# Patient Record
Sex: Male | Born: 1955
Health system: Southern US, Community
[De-identification: ages and names within clinical notes are randomized; demographics above are authoritative.]

## PROBLEM LIST (undated history)

## (undated) DIAGNOSIS — F419 Anxiety disorder, unspecified: Secondary | ICD-10-CM

## (undated) DIAGNOSIS — H548 Legal blindness, as defined in USA: Secondary | ICD-10-CM

## (undated) DIAGNOSIS — E785 Hyperlipidemia, unspecified: Secondary | ICD-10-CM

## (undated) DIAGNOSIS — K219 Gastro-esophageal reflux disease without esophagitis: Secondary | ICD-10-CM

## (undated) DIAGNOSIS — N400 Enlarged prostate without lower urinary tract symptoms: Secondary | ICD-10-CM

## (undated) DIAGNOSIS — K222 Esophageal obstruction: Secondary | ICD-10-CM

## (undated) DIAGNOSIS — K635 Polyp of colon: Secondary | ICD-10-CM

## (undated) DIAGNOSIS — J189 Pneumonia, unspecified organism: Secondary | ICD-10-CM

## (undated) DIAGNOSIS — T7840XA Allergy, unspecified, initial encounter: Secondary | ICD-10-CM

## (undated) DIAGNOSIS — M069 Rheumatoid arthritis, unspecified: Secondary | ICD-10-CM

## (undated) DIAGNOSIS — M1990 Unspecified osteoarthritis, unspecified site: Secondary | ICD-10-CM

## (undated) HISTORY — PX: OTHER SURGICAL HISTORY: SHX169

## (undated) HISTORY — PX: FRACTURE SURGERY: SHX138

## (undated) HISTORY — DX: Hyperlipidemia, unspecified: E78.5

## (undated) HISTORY — PX: ESOPHAGOGASTRODUODENOSCOPY: SHX1529

## (undated) HISTORY — PX: POLYPECTOMY: SHX149

## (undated) HISTORY — DX: Rheumatoid arthritis, unspecified: M06.9

## (undated) HISTORY — PX: TONSILLECTOMY: SUR1361

## (undated) HISTORY — DX: Unspecified osteoarthritis, unspecified site: M19.90

## (undated) HISTORY — DX: Anxiety disorder, unspecified: F41.9

## (undated) HISTORY — DX: Esophageal obstruction: K22.2

## (undated) HISTORY — DX: Allergy, unspecified, initial encounter: T78.40XA

## (undated) HISTORY — PX: UPPER GASTROINTESTINAL ENDOSCOPY: SHX188

## (undated) HISTORY — PX: EYE SURGERY: SHX253

---

## 2012-01-23 HISTORY — PX: COLONOSCOPY: SHX174

## 2012-10-17 DIAGNOSIS — Z86010 Personal history of colonic polyps: Secondary | ICD-10-CM | POA: Insufficient documentation

## 2013-08-19 ENCOUNTER — Encounter (HOSPITAL_COMMUNITY): Payer: Self-pay | Admitting: Emergency Medicine

## 2013-08-19 ENCOUNTER — Emergency Department (HOSPITAL_COMMUNITY)
Admission: EM | Admit: 2013-08-19 | Discharge: 2013-08-19 | Disposition: A | Discharge status: Home / Self Care | Payer: 59 | Source: Home / Self Care | Attending: Family Medicine | Admitting: Family Medicine

## 2013-08-19 DIAGNOSIS — S61209A Unspecified open wound of unspecified finger without damage to nail, initial encounter: Secondary | ICD-10-CM

## 2013-08-19 DIAGNOSIS — S61011A Laceration without foreign body of right thumb without damage to nail, initial encounter: Secondary | ICD-10-CM

## 2013-08-19 DIAGNOSIS — W010XXA Fall on same level from slipping, tripping and stumbling without subsequent striking against object, initial encounter: Secondary | ICD-10-CM

## 2013-08-19 MED ORDER — CEPHALEXIN 500 MG PO CAPS: 500.0000 mg | ORAL_CAPSULE | Freq: Three times a day (TID) | ORAL | Status: DC

## 2013-08-19 NOTE — Discharge Instructions (Signed)

## 2013-08-19 NOTE — ED Notes (Signed)
Reports laceration to right thumb.  Pt states that he tripped and fell on a saw.   Bleeding is controlled.  Wound cleaned and covered.

## 2013-08-19 NOTE — ED Provider Notes (Signed)
CSN: [id number]     Arrival date & time 08/19/13  1018 History   First MD Initiated Contact with Patient 08/19/13 1034     Chief Complaint  Patient presents with  . Laceration   (Consider location/radiation/quality/duration/timing/severity/associated sxs/prior Treatment) HPI Comments: 58 year old male presents complaining of a laceration to his right thumb. He tripped and fell on a saw. The bleeding has been controlled with direct pressure but he thinks he may need stitches. He denies any numbness in the thumb or any other injury. He also complains of a shoulder injury sustained while being arrested yesterday, but he does not want to be seen for that. His history includes rheumatoid arthritis, he is on xeljanz for that.   History reviewed. No pertinent past medical history. History reviewed. No pertinent past surgical history. History reviewed. No pertinent family history. History  Substance Use Topics  . Smoking status: Never Smoker   . Smokeless tobacco: Not on file  . Alcohol Use: No    Review of Systems  Skin: Positive for wound.  All other systems reviewed and are negative.   Allergies  Review of patient's allergies indicates no known allergies.  Home Medications   Prior to Admission medications   Medication Sig Start Date End Date Taking? Authorizing Provider  cephALEXin (KEFLEX) 500 MG capsule Take 1 capsule (500 mg total) by mouth 3 (three) times daily. 08/19/13   Freeman Caldron Yuvia Plant, PA-C   BP 146/90  Pulse 108  Temp(Src) 98.2 F (36.8 C) (Oral)  Resp 18  SpO2 98% Physical Exam  Nursing note and vitals reviewed. Constitutional: He is oriented to person, place, and time. He appears well-developed and well-nourished. No distress.  HENT:  Head: Normocephalic.  Pulmonary/Chest: Effort normal. No respiratory distress.  Musculoskeletal:       Right hand: He exhibits laceration.       Hands: Neurological: He is alert and oriented to person, place, and time. Coordination  normal.  Skin: Skin is warm and dry. No rash noted. He is not diaphoretic.  Psychiatric: He has a normal mood and affect. Judgment normal.    ED Course  LACERATION REPAIR Date/Time: 08/19/2013 12:37 PM Performed by: Allena Katz, H Authorized by: Allena Katz, H Consent: Verbal consent obtained. Risks and benefits: risks, benefits and alternatives were discussed Consent given by: patient Patient identity confirmed: verbally with patient Time out: Immediately prior to procedure a "time out" was called to verify the correct patient, procedure, equipment, support staff and site/side marked as required. Body area: upper extremity Location details: right hand Laceration length: 6 cm Foreign bodies: no foreign bodies Tendon involvement: none Nerve involvement: none Vascular damage: no Anesthesia: local infiltration Local anesthetic: lidocaine 2% with epinephrine Anesthetic total: 10 ml Preparation: Patient was prepped and draped in the usual sterile fashion. Irrigation solution: saline and tap water Irrigation method: tap and syringe Amount of cleaning: extensive Debridement: none Degree of undermining: none Skin closure: 5-0 Prolene Number of sutures: 14 Technique: simple Approximation: close Approximation difficulty: complex Dressing: antibiotic ointment and 4x4 sterile gauze Patient tolerance: Patient tolerated the procedure well with no immediate complications.   (including critical care time) Labs Review Labs Reviewed - No data to display  Imaging Review No results found.   MDM   1. Laceration of thumb, right, initial encounter    The wound was anesthetized with 2% lidocaine with epinephrine and copiously irrigated with tap water and then 1 L of sterile water. The wound was explored and no foreign bodies identified,  no nerves or tendon involvement. Repaired with 14 sutures 5-0 Prolene, will use Keflex for infection prophylaxis, especially considering that he is on  a DMARD for rheumatoid arthritis. Followup in 10 days for suture removal. He already has pain medicine at home  Meds ordered this encounter  Medications  . cephALEXin (KEFLEX) 500 MG capsule    Sig: Take 1 capsule (500 mg total) by mouth 3 (three) times daily.    Dispense:  15 capsule    Refill:  0    Order Specific Question:  Supervising Provider    Answer:  MERRELL, DAVID J [[id number]]     14 sutures 5-0 prolene   Liam Graham, PA-C 08/19/13 1243

## 2013-08-19 NOTE — ED Provider Notes (Signed)
Medical screening examination/treatment/procedure(s) were performed by a resident physician or non-physician practitioner and as the supervising physician I was immediately available for consultation/collaboration.  Linna Darner, MD Family Medicine   Waldemar Dickens, MD 08/19/13 1346

## 2013-09-17 ENCOUNTER — Other Ambulatory Visit: Payer: Self-pay | Admitting: Orthopedic Surgery

## 2013-09-17 DIAGNOSIS — S43422A Sprain of left rotator cuff capsule, initial encounter: Secondary | ICD-10-CM

## 2013-09-24 ENCOUNTER — Ambulatory Visit
Admission: RE | Admit: 2013-09-24 | Discharge: 2013-09-24 | Disposition: A | Discharge status: Home / Self Care | Payer: 59 | Source: Ambulatory Visit | Attending: Orthopedic Surgery | Admitting: Orthopedic Surgery

## 2013-09-24 DIAGNOSIS — S43422A Sprain of left rotator cuff capsule, initial encounter: Secondary | ICD-10-CM

## 2013-09-24 MED ORDER — IOHEXOL 180 MG/ML  SOLN
15.0000 mL | Freq: Once | INTRAMUSCULAR | Status: AC | PRN
  Administered 2013-09-24: 15 mL via INTRA_ARTICULAR

## 2013-10-08 DIAGNOSIS — H02831 Dermatochalasis of right upper eyelid: Secondary | ICD-10-CM | POA: Insufficient documentation

## 2013-10-08 DIAGNOSIS — H02834 Dermatochalasis of left upper eyelid: Secondary | ICD-10-CM

## 2013-11-04 ENCOUNTER — Encounter (HOSPITAL_COMMUNITY): Payer: Self-pay

## 2013-11-04 NOTE — H&P (Signed)
  Grant Hunter is an 58 y.o. male.    Chief Complaint: left shoulder pain  HPI: Pt is a 58 y.o. male complaining of left shoulder pain for multiple months. Pain had continually increased since the beginning. X-rays in the clinic show [facility] arthrosis and slap tear. Pt has tried various conservative treatments which have failed to alleviate their symptoms, including therapy and injections. Various options are discussed with the patient. Risks, benefits and expectations were discussed with the patient. Patient understand the risks, benefits and expectations and wishes to proceed with surgery.   PCP:  Sharmon Leyden, MD  D/C Plans:  Home   PMH: No past medical history on file.  PSH: No past surgical history on file.  Social History:  reports that he has never smoked. He does not have any smokeless tobacco history on file. He reports that he does not drink alcohol or use illicit drugs.  Allergies:  No Known Allergies  Medications: No current facility-administered medications for this encounter.   Current Outpatient Prescriptions  Medication Sig Dispense Refill  . cephALEXin (KEFLEX) 500 MG capsule Take 1 capsule (500 mg total) by mouth 3 (three) times daily.  15 capsule  0    No results found for this or any previous visit (from the past 48 hour(s)). No results found.  ROS: Pain with rom of the left upper extremity  Physical Exam:  Alert and oriented 58 y.o. male in no acute distress Cranial nerves 2-12 intact Cervical spine: full rom with no tenderness, nv intact distally Chest: active breath sounds bilaterally, no wheeze rhonchi or rales Heart: regular rate and rhythm, no murmur Abd: non tender non distended with active bowel sounds Hip is stable with rom  Left shoulder with positive obrien's and cross arm tests nv intact distally rom is minimally decreased No rashes or edema Strength of ER and IR 4.5/5   Assessment/Plan Assessment: left shoulder pain secondary to [facility]  arthrosis and slap tear  Plan: Patient will undergo a left shoulder scope with biceps tenodesis by Dr. Veverly Fells at [facility]. Risks benefits and expectations were discussed with the patient. Patient understand risks, benefits and expectations and wishes to proceed.

## 2013-11-05 ENCOUNTER — Encounter (HOSPITAL_COMMUNITY): Payer: Self-pay

## 2013-11-05 ENCOUNTER — Encounter (HOSPITAL_COMMUNITY)
Admission: RE | Admit: 2013-11-05 | Discharge: 2013-11-05 | Disposition: A | Discharge status: Home / Self Care | Payer: 59 | Source: Ambulatory Visit | Attending: Orthopedic Surgery | Admitting: Orthopedic Surgery

## 2013-11-05 ENCOUNTER — Other Ambulatory Visit (HOSPITAL_COMMUNITY): Payer: 59

## 2013-11-05 ENCOUNTER — Other Ambulatory Visit (HOSPITAL_COMMUNITY): Payer: Self-pay | Admitting: *Deleted

## 2013-11-05 DIAGNOSIS — M25512 Pain in left shoulder: Secondary | ICD-10-CM | POA: Diagnosis not present

## 2013-11-05 DIAGNOSIS — Z01818 Encounter for other preprocedural examination: Secondary | ICD-10-CM | POA: Diagnosis not present

## 2013-11-05 HISTORY — DX: Pneumonia, unspecified organism: J18.9

## 2013-11-05 HISTORY — DX: Polyp of colon: K63.5

## 2013-11-05 HISTORY — DX: Legal blindness, as defined in USA: H54.8

## 2013-11-05 HISTORY — DX: Gastro-esophageal reflux disease without esophagitis: K21.9

## 2013-11-05 HISTORY — DX: Benign prostatic hyperplasia without lower urinary tract symptoms: N40.0

## 2013-11-05 LAB — BASIC METABOLIC PANEL
Anion gap: 13 (ref 5–15)
BUN: 15 mg/dL (ref 6–23)
CALCIUM: 9.1 mg/dL (ref 8.4–10.5)
CO2: 26 mEq/L (ref 19–32)
Chloride: 99 mEq/L (ref 96–112)
Creatinine, Ser: 1.14 mg/dL (ref 0.50–1.35)
GFR calc Af Amer: 79 mL/min — ABNORMAL LOW (ref >90)
GFR, EST NON AFRICAN AMERICAN: 68 mL/min — AB (ref >90)
GLUCOSE: 98 mg/dL (ref 70–99)
Potassium: 4.5 mEq/L (ref 3.7–5.3)
Sodium: 138 mEq/L (ref 137–147)

## 2013-11-05 LAB — CBC
HCT: 41.2 % (ref 39.0–52.0)
Hemoglobin: 13.5 g/dL (ref 13.0–17.0)
MCH: 29.2 pg (ref 26.0–34.0)
MCHC: 32.8 g/dL (ref 30.0–36.0)
MCV: 89.2 fL (ref 78.0–100.0)
Platelets: 244 10*3/uL (ref 150–400)
RBC: 4.62 MIL/uL (ref 4.22–5.81)
RDW: 13.9 % (ref 11.5–15.5)
WBC: 7.7 10*3/uL (ref 4.0–10.5)

## 2013-11-05 NOTE — Pre-Procedure Instructions (Signed)
Grant Hunter  11/05/2013   Your procedure is scheduled on:  Monday, November 09, 2013 at 1:55 PM.   Report to [facility] Entrance "A" Admitting Office at 12:00 Noon.   Call this number if you have problems the morning of surgery: [phone]   Remember:   Do not eat food or drink liquids after midnight Sunday, 11/08/13.   Take these medicines the morning of surgery with A SIP OF WATER:  omeprazole (PRILOSEC), traMADol (ULTRAM), ALPRAZolam [name]) - if needed.  Stop Krill Oil and Vitamin E as of today.   Do not wear jewelry.  Do not wear lotions, powders, or cologne. You may NOT wear deodorant.  Men may shave face and neck.  Do not bring valuables to the hospital.  [facility] is not responsible                  for any belongings or valuables.               Contacts, dentures or bridgework may not be worn into surgery.  Leave suitcase in the car. After surgery it may be brought to your room.  For patients admitted to the hospital, discharge time is determined by your                treatment team.               Patients discharged the day of surgery will not be allowed to drive home.   Special Instructions: [place] - Preparing for Surgery  Before surgery, you can play an important role.  Because skin is not sterile, your skin needs to be as free of germs as possible.  You can reduce the number of germs on you skin by washing with CHG (chlorahexidine gluconate) soap before surgery.  CHG is an antiseptic cleaner which kills germs and bonds with the skin to continue killing germs even after washing.  Please DO NOT use if you have an allergy to CHG or antibacterial soaps.  If your skin becomes reddened/irritated stop using the CHG and inform your nurse when you arrive at Short Stay.  Do not shave (including legs and underarms) for at least 48 hours prior to the first CHG shower.  You may shave your face.  Please follow these instructions carefully:   1.  Shower with CHG  Soap the night before surgery and the                                morning of Surgery.  2.  If you choose to wash your hair, wash your hair first as usual with your       normal shampoo.  3.  After you shampoo, rinse your hair and body thoroughly to remove the                      Shampoo.  4.  Use CHG as you would any other liquid soap.  You can apply chg directly       to the skin and wash gently with scrungie or a clean washcloth.  5.  Apply the CHG Soap to your body ONLY FROM THE NECK DOWN.        Do not use on open wounds or open sores.  Avoid contact with your eyes, ears, mouth and genitals (private parts).  Wash genitals (private parts) with your normal soap.  6.  Wash thoroughly, paying special attention to the area where your surgery        will be performed.  7.  Thoroughly rinse your body with warm water from the neck down.  8.  DO NOT shower/wash with your normal soap after using and rinsing off       the CHG Soap.  9.  Pat yourself dry with a clean towel.            10.  Wear clean pajamas.            11.  Place clean sheets on your bed the night of your first shower and do not        sleep with pets.  Day of Surgery  Do not apply any lotions/deodorants the morning of surgery.  Please wear clean clothes to the hospital.     Please read over the following fact sheets that you were given: Pain Booklet, Coughing and Deep Breathing and Surgical Site Infection Prevention

## 2013-11-05 NOTE — Progress Notes (Addendum)
Pt's Rheumatologist is Dr. Ouida Sills. PCP is Dr. Frederic Jericho at [facility].  Stop Bang assessment sent to Dr. Frederic Jericho  Pt stopped Morrie Sheldon 10/29/13 per order of his rheumatologist.

## 2013-11-05 NOTE — Progress Notes (Signed)
11/05/13 [place]  Have you ever been diagnosed with sleep apnea through a sleep study? No  Do you snore loudly (loud enough to be heard through closed doors)?  0  Do you often feel tired, fatigued, or sleepy during the daytime? 1  Has anyone observed you stop breathing during your sleep? 0  Do you have, or are you being treated for high blood pressure? 0  BMI more than 35 kg/m2? 0  Age over 60 years old? 1  Neck circumference greater than 40 cm/16 inches? 1 (17.5)  Gender: 1  Obstructive Sleep Apnea Score 4  Score 4 or greater  Results sent to PCP

## 2013-11-06 NOTE — Progress Notes (Signed)
Spoke with pt.'s wife, informed them that surgery time has changed that has  him to presenting  to hosp. At 10:00 on 11/09/2013

## 2013-11-08 MED ORDER — CEFAZOLIN SODIUM-DEXTROSE 2-3 GM-% IV SOLR: 2.0000 g | INTRAVENOUS | Status: AC

## 2013-11-09 ENCOUNTER — Ambulatory Visit (HOSPITAL_COMMUNITY): Payer: 59 | Admitting: Certified Registered"

## 2013-11-09 ENCOUNTER — Encounter (HOSPITAL_COMMUNITY)
Admission: RE | Disposition: A | Discharge status: Home / Self Care | Payer: Self-pay | Source: Ambulatory Visit | Attending: Orthopedic Surgery

## 2013-11-09 ENCOUNTER — Ambulatory Visit (HOSPITAL_COMMUNITY)
Admission: RE | Admit: 2013-11-09 | Discharge: 2013-11-09 | Disposition: A | Discharge status: Home / Self Care | Payer: 59 | Source: Ambulatory Visit | Attending: Orthopedic Surgery | Admitting: Orthopedic Surgery

## 2013-11-09 ENCOUNTER — Encounter (HOSPITAL_COMMUNITY): Payer: 59 | Admitting: Certified Registered"

## 2013-11-09 ENCOUNTER — Encounter (HOSPITAL_COMMUNITY): Payer: Self-pay | Admitting: *Deleted

## 2013-11-09 DIAGNOSIS — M75112 Incomplete rotator cuff tear or rupture of left shoulder, not specified as traumatic: Secondary | ICD-10-CM | POA: Diagnosis not present

## 2013-11-09 DIAGNOSIS — Z87891 Personal history of nicotine dependence: Secondary | ICD-10-CM | POA: Insufficient documentation

## 2013-11-09 DIAGNOSIS — X58XXXA Exposure to other specified factors, initial encounter: Secondary | ICD-10-CM | POA: Insufficient documentation

## 2013-11-09 DIAGNOSIS — M19012 Primary osteoarthritis, left shoulder: Secondary | ICD-10-CM | POA: Diagnosis not present

## 2013-11-09 DIAGNOSIS — K219 Gastro-esophageal reflux disease without esophagitis: Secondary | ICD-10-CM | POA: Insufficient documentation

## 2013-11-09 DIAGNOSIS — S43432A Superior glenoid labrum lesion of left shoulder, initial encounter: Secondary | ICD-10-CM | POA: Insufficient documentation

## 2013-11-09 HISTORY — PX: SHOULDER ARTHROSCOPY WITH SUBACROMIAL DECOMPRESSION AND OPEN ROTATOR C: SHX5688

## 2013-11-09 SURGERY — SHOULDER ARTHROSCOPY WITH SUBACROMIAL DECOMPRESSION AND OPEN ROTATOR CUFF REPAIR, OPEN BICEPS TENDON REPAIR
Anesthesia: General | Site: Shoulder | Laterality: Left

## 2013-11-09 MED ORDER — ARTIFICIAL TEARS OP OINT
TOPICAL_OINTMENT | OPHTHALMIC | Status: DC | PRN
  Administered 2013-11-09: 1 via OPHTHALMIC

## 2013-11-09 MED ORDER — PHENYLEPHRINE HCL 10 MG/ML IJ SOLN
INTRAMUSCULAR | Status: DC | PRN
  Administered 2013-11-09: 80 ug via INTRAVENOUS
  Administered 2013-11-09: 120 ug via INTRAVENOUS
  Administered 2013-11-09: 80 ug via INTRAVENOUS

## 2013-11-09 MED ORDER — NEOSTIGMINE METHYLSULFATE 10 MG/10ML IV SOLN
INTRAVENOUS | Status: AC
  Filled 2013-11-09: qty 1

## 2013-11-09 MED ORDER — LACTATED RINGERS IV SOLN
INTRAVENOUS | Status: DC | PRN
  Administered 2013-11-09 (×2): via INTRAVENOUS

## 2013-11-09 MED ORDER — MIDAZOLAM HCL 2 MG/2ML IJ SOLN
INTRAMUSCULAR | Status: AC
  Administered 2013-11-09: 2 mg via INTRAVENOUS
  Filled 2013-11-09: qty 2

## 2013-11-09 MED ORDER — KETOROLAC TROMETHAMINE 30 MG/ML IJ SOLN
INTRAMUSCULAR | Status: AC
  Filled 2013-11-09: qty 1

## 2013-11-09 MED ORDER — FENTANYL CITRATE 0.05 MG/ML IJ SOLN
INTRAMUSCULAR | Status: DC | PRN
  Administered 2013-11-09: 100 ug via INTRAVENOUS

## 2013-11-09 MED ORDER — ONDANSETRON HCL 4 MG/2ML IJ SOLN
INTRAMUSCULAR | Status: DC | PRN
  Administered 2013-11-09: 4 mg via INTRAVENOUS

## 2013-11-09 MED ORDER — GLYCOPYRROLATE 0.2 MG/ML IJ SOLN
INTRAMUSCULAR | Status: DC | PRN
  Administered 2013-11-09: 0.4 mg via INTRAVENOUS

## 2013-11-09 MED ORDER — BUPIVACAINE-EPINEPHRINE (PF) 0.5% -1:200000 IJ SOLN
INTRAMUSCULAR | Status: DC | PRN
  Administered 2013-11-09: 30 mL via PERINEURAL

## 2013-11-09 MED ORDER — BUPIVACAINE-EPINEPHRINE (PF) 0.25% -1:200000 IJ SOLN
INTRAMUSCULAR | Status: AC
  Filled 2013-11-09: qty 30

## 2013-11-09 MED ORDER — ROCURONIUM BROMIDE 100 MG/10ML IV SOLN
INTRAVENOUS | Status: DC | PRN
  Administered 2013-11-09: 50 mg via INTRAVENOUS

## 2013-11-09 MED ORDER — KETOROLAC TROMETHAMINE 30 MG/ML IJ SOLN
INTRAMUSCULAR | Status: DC | PRN
  Administered 2013-11-09: 30 mg via INTRAVENOUS

## 2013-11-09 MED ORDER — LIDOCAINE HCL (CARDIAC) 20 MG/ML IV SOLN: INTRAVENOUS | Status: DC | PRN

## 2013-11-09 MED ORDER — PHENYLEPHRINE HCL 10 MG/ML IJ SOLN
10.0000 mg | INTRAVENOUS | Status: DC | PRN
  Administered 2013-11-09: 25 ug/min via INTRAVENOUS

## 2013-11-09 MED ORDER — ARTIFICIAL TEARS OP OINT
TOPICAL_OINTMENT | OPHTHALMIC | Status: AC
  Filled 2013-11-09: qty 3.5

## 2013-11-09 MED ORDER — OXYCODONE-ACETAMINOPHEN 5-325 MG PO TABS: 1.0000 | ORAL_TABLET | ORAL | Status: DC | PRN

## 2013-11-09 MED ORDER — PROPOFOL 10 MG/ML IV BOLUS
INTRAVENOUS | Status: DC | PRN
  Administered 2013-11-09: 200 mg via INTRAVENOUS

## 2013-11-09 MED ORDER — ONDANSETRON HCL 4 MG/2ML IJ SOLN
INTRAMUSCULAR | Status: AC
  Filled 2013-11-09: qty 2

## 2013-11-09 MED ORDER — PROPOFOL 10 MG/ML IV BOLUS
INTRAVENOUS | Status: AC
  Filled 2013-11-09: qty 20

## 2013-11-09 MED ORDER — METHOCARBAMOL 500 MG PO TABS: 500.0000 mg | ORAL_TABLET | Freq: Three times a day (TID) | ORAL | Status: DC | PRN

## 2013-11-09 MED ORDER — MIDAZOLAM HCL 2 MG/2ML IJ SOLN
1.0000 mg | INTRAMUSCULAR | Status: DC | PRN
  Administered 2013-11-09: 2 mg via INTRAVENOUS

## 2013-11-09 MED ORDER — ROCURONIUM BROMIDE 50 MG/5ML IV SOLN
INTRAVENOUS | Status: AC
  Filled 2013-11-09: qty 1

## 2013-11-09 MED ORDER — GLYCOPYRROLATE 0.2 MG/ML IJ SOLN
INTRAMUSCULAR | Status: AC
  Filled 2013-11-09: qty 3

## 2013-11-09 MED ORDER — SODIUM CHLORIDE 0.9 % IR SOLN
Status: DC | PRN
  Administered 2013-11-09: 3000 mL

## 2013-11-09 MED ORDER — LACTATED RINGERS IV SOLN
INTRAVENOUS | Status: DC
  Administered 2013-11-09: 11:00:00 via INTRAVENOUS

## 2013-11-09 MED ORDER — CHLORHEXIDINE GLUCONATE 4 % EX LIQD
60.0000 mL | Freq: Once | CUTANEOUS | Status: DC
  Filled 2013-11-09: qty 60

## 2013-11-09 MED ORDER — CEFAZOLIN SODIUM-DEXTROSE 2-3 GM-% IV SOLR
INTRAVENOUS | Status: AC
  Administered 2013-11-09: 2 g via INTRAVENOUS
  Filled 2013-11-09: qty 50

## 2013-11-09 MED ORDER — FENTANYL CITRATE 0.05 MG/ML IJ SOLN
50.0000 ug | INTRAMUSCULAR | Status: DC | PRN
  Administered 2013-11-09: 100 ug via INTRAVENOUS

## 2013-11-09 MED ORDER — FENTANYL CITRATE 0.05 MG/ML IJ SOLN
INTRAMUSCULAR | Status: AC
  Filled 2013-11-09: qty 5

## 2013-11-09 MED ORDER — FENTANYL CITRATE 0.05 MG/ML IJ SOLN
INTRAMUSCULAR | Status: AC
  Administered 2013-11-09: 100 ug via INTRAVENOUS
  Filled 2013-11-09: qty 2

## 2013-11-09 MED ORDER — NEOSTIGMINE METHYLSULFATE 10 MG/10ML IV SOLN
INTRAVENOUS | Status: DC | PRN
  Administered 2013-11-09: 3 mg via INTRAVENOUS

## 2013-11-09 MED ORDER — BUPIVACAINE-EPINEPHRINE 0.25% -1:200000 IJ SOLN
INTRAMUSCULAR | Status: DC | PRN
  Administered 2013-11-09: 4 mL

## 2013-11-09 MED ORDER — LIDOCAINE HCL (CARDIAC) 20 MG/ML IV SOLN
INTRAVENOUS | Status: AC
  Filled 2013-11-09: qty 5

## 2013-11-09 SURGICAL SUPPLY — 70 items
ANCHOR ALL- SUT RC 2 SUT Y-K (Anchor) ×2 IMPLANT
ANCHOR ALL-SUT FLEX 1.3 Y-KNOT (Anchor) ×3 IMPLANT
ANCHOR ALL-SUT RC 2 SUT Y-K (Anchor) ×1 IMPLANT
BIT DRILL 1.3M DISPOSABLE (BIT) ×3 IMPLANT
BLADE LONG MED 31MMX9MM (MISCELLANEOUS) ×1
BLADE LONG MED 31X9 (MISCELLANEOUS) ×2 IMPLANT
BLADE SURG 11 STRL SS (BLADE) ×3 IMPLANT
BLADE W/14.0X25.5MM (BLADE) ×3 IMPLANT
BUR OVAL 4.0 (BURR) ×3 IMPLANT
CLOSURE STERI-STRIP 1/2X4 (GAUZE/BANDAGES/DRESSINGS) ×1
CLOSURE WOUND 1/2 X4 (GAUZE/BANDAGES/DRESSINGS) ×2
CLSR STERI-STRIP ANTIMIC 1/2X4 (GAUZE/BANDAGES/DRESSINGS) ×2 IMPLANT
COVER SURGICAL LIGHT HANDLE (MISCELLANEOUS) ×3 IMPLANT
DRAPE INCISE IOBAN 66X45 STRL (DRAPES) ×3 IMPLANT
DRAPE STERI 35X30 U-POUCH (DRAPES) ×3 IMPLANT
DRAPE U-SHAPE 47X51 STRL (DRAPES) ×3 IMPLANT
DRSG EMULSION OIL 3X3 NADH (GAUZE/BANDAGES/DRESSINGS) ×3 IMPLANT
DRSG PAD ABDOMINAL 8X10 ST (GAUZE/BANDAGES/DRESSINGS) ×3 IMPLANT
DURAPREP 26ML APPLICATOR (WOUND CARE) ×3 IMPLANT
ELECT NEEDLE TIP 2.8 STRL (NEEDLE) ×3 IMPLANT
ELECT REM PT RETURN 9FT ADLT (ELECTROSURGICAL) ×3
ELECTRODE REM PT RTRN 9FT ADLT (ELECTROSURGICAL) ×1 IMPLANT
GAUZE SPONGE 4X4 12PLY STRL (GAUZE/BANDAGES/DRESSINGS) ×3 IMPLANT
GLOVE BIOGEL PI IND STRL 6.5 (GLOVE) ×1 IMPLANT
GLOVE BIOGEL PI INDICATOR 6.5 (GLOVE) ×2
GLOVE BIOGEL PI ORTHO PRO 7.5 (GLOVE) ×2
GLOVE BIOGEL PI ORTHO PRO SZ8 (GLOVE) ×2
GLOVE ORTHO TXT STRL SZ7.5 (GLOVE) ×3 IMPLANT
GLOVE PI ORTHO PRO STRL 7.5 (GLOVE) ×1 IMPLANT
GLOVE PI ORTHO PRO STRL SZ8 (GLOVE) ×1 IMPLANT
GLOVE SURG ORTHO 8.5 STRL (GLOVE) ×3 IMPLANT
GOWN STRL REUS W/ TWL XL LVL3 (GOWN DISPOSABLE) ×4 IMPLANT
GOWN STRL REUS W/TWL XL LVL3 (GOWN DISPOSABLE) ×8
KIT BASIN OR (CUSTOM PROCEDURE TRAY) ×3 IMPLANT
KIT ROOM TURNOVER OR (KITS) ×3 IMPLANT
MANIFOLD NEPTUNE II (INSTRUMENTS) ×3 IMPLANT
NDL SUT .5 MAYO 1.404X.05X (NEEDLE) ×1 IMPLANT
NDL SUT 6 .5 CRC .975X.05 MAYO (NEEDLE) ×1 IMPLANT
NEEDLE HYPO 25GX1X1/2 BEV (NEEDLE) ×3 IMPLANT
NEEDLE MAYO TAPER (NEEDLE) ×4
NEEDLE SPNL 18GX3.5 QUINCKE PK (NEEDLE) IMPLANT
NS IRRIG 1000ML POUR BTL (IV SOLUTION) ×3 IMPLANT
PACK SHOULDER (CUSTOM PROCEDURE TRAY) ×3 IMPLANT
PAD ARMBOARD 7.5X6 YLW CONV (MISCELLANEOUS) ×6 IMPLANT
RESECTOR FULL RADIUS 4.2MM (BLADE) ×6 IMPLANT
SET ARTHROSCOPY TUBING (MISCELLANEOUS) ×2
SET ARTHROSCOPY TUBING LN (MISCELLANEOUS) ×1 IMPLANT
SLING ARM IMMOBILIZER LRG (SOFTGOODS) ×3 IMPLANT
SLING ARM LRG ADULT FOAM STRAP (SOFTGOODS) IMPLANT
SLING ARM MED ADULT FOAM STRAP (SOFTGOODS) IMPLANT
SPONGE GAUZE 4X4 12PLY STER LF (GAUZE/BANDAGES/DRESSINGS) ×3 IMPLANT
STRIP CLOSURE SKIN 1/2X4 (GAUZE/BANDAGES/DRESSINGS) ×4 IMPLANT
SUT BONE WAX W31G (SUTURE) ×3 IMPLANT
SUT FIBERWIRE #2 38 T-5 BLUE (SUTURE)
SUT MNCRL AB 3-0 PS2 18 (SUTURE) ×3 IMPLANT
SUT VIC AB 0 CT1 27 (SUTURE) ×2
SUT VIC AB 0 CT1 27XBRD ANBCTR (SUTURE) ×1 IMPLANT
SUT VIC AB 0 CT2 27 (SUTURE) ×6 IMPLANT
SUT VIC AB 2-0 CT1 27 (SUTURE) ×2
SUT VIC AB 2-0 CT1 TAPERPNT 27 (SUTURE) ×1 IMPLANT
SUT VICRYL 0 CT 1 36IN (SUTURE) ×3 IMPLANT
SUTURE FIBERWR #2 38 T-5 BLUE (SUTURE) IMPLANT
SYR CONTROL 10ML LL (SYRINGE) ×3 IMPLANT
TAPE CLOTH SURG 6X10 WHT LF (GAUZE/BANDAGES/DRESSINGS) ×3 IMPLANT
TOWEL OR 17X24 6PK STRL BLUE (TOWEL DISPOSABLE) ×3 IMPLANT
TOWEL OR 17X26 10 PK STRL BLUE (TOWEL DISPOSABLE) ×3 IMPLANT
TUBE CONNECTING 12'X1/4 (SUCTIONS) ×1
TUBE CONNECTING 12X1/4 (SUCTIONS) ×2 IMPLANT
WAND HAND CNTRL MULTIVAC 90 (MISCELLANEOUS) ×3 IMPLANT
WATER STERILE IRR 1000ML POUR (IV SOLUTION) ×3 IMPLANT

## 2013-11-09 NOTE — Discharge Instructions (Addendum)
Ice to the shoulder as much as possible.  May remove the shoulder sling in the house, then hug a pillow  No lifting pushing or pulling with the left shoulder/arm  Do exercises every hour while awake - lap slides, pendulums, door hinge(hug self, then rotate arm away from body leading with your thumb like you are hitch hiking), assisted forward lifts, use right arm to lift the left arm up in front of you to eye level.  Keep the incision clean and dry for one week, then ok to get wet in the shower.  Follow up in the office with Dr Veverly Fells in two weeks  [phone]  What to eat:  For your first meals, you should eat lightly; only small meals initially.  If you do not have nausea, you may eat larger meals.  Avoid spicy, greasy and heavy food.    General Anesthesia, Adult, Care After  Refer to this sheet in the next few weeks. These instructions provide you with information on caring for yourself after your procedure. Your health care provider may also give you more specific instructions. Your treatment has been planned according to current medical practices, but problems sometimes occur. Call your health care provider if you have any problems or questions after your procedure.  WHAT TO EXPECT AFTER THE PROCEDURE  After the procedure, it is typical to experience:  Sleepiness.  Nausea and vomiting. HOME CARE INSTRUCTIONS  For the first 24 hours after general anesthesia:  Have a responsible person with you.  Do not drive a car. If you are alone, do not take public transportation.  Do not drink alcohol.  Do not take medicine that has not been prescribed by your health care provider.  Do not sign important papers or make important decisions.  You may resume a normal diet and activities as directed by your health care provider.  Change bandages (dressings) as directed.  If you have questions or problems that seem related to general anesthesia, call the hospital and ask for the anesthetist or  anesthesiologist on call. SEEK MEDICAL CARE IF:  You have nausea and vomiting that continue the day after anesthesia.  You develop a rash. SEEK IMMEDIATE MEDICAL CARE IF:  You have difficulty breathing.  You have chest pain.  You have any allergic problems. Document Released: 04/16/2000 Document Revised: 09/10/2012 Document Reviewed: 07/24/2012  [facility] Patient Information 2014 [place], [place].

## 2013-11-09 NOTE — Interval H&P Note (Signed)
History and Physical Interval Note:  11/09/2013 12:32 PM  Grant Hunter  has presented today for surgery, with the diagnosis of  LEFT SHOULDER SLAP AC ARTHROSIS   The various methods of treatment have been discussed with the patient and family. After consideration of risks, benefits and other options for treatment, the patient has consented to  Procedure(s): LEFT SHOULDER ARTHROSCOPY WITH SUBACROMIAL DECOMPRESSION AND OPEN BICEP TENODESIS, OPEN DISTAL CLAVICLE RESECTION  (Left) as a surgical intervention .  The patient's history has been reviewed, patient examined, no change in status, stable for surgery.  I have reviewed the patient's chart and labs.  Questions were answered to the patient's satisfaction.     Otto Felkins,STEVEN R

## 2013-11-09 NOTE — Anesthesia Procedure Notes (Addendum)
Anesthesia Regional Block:  Interscalene brachial plexus block  Pre-Anesthetic Checklist: ,, timeout performed, Correct Patient, Correct Site, Correct Laterality, Correct Procedure, Correct Position, site marked, Risks and benefits discussed,  Surgical consent,  Pre-op evaluation,  At surgeon's request and post-op pain management  Laterality: Left  Prep: chloraprep       Needles:  Injection technique: Single-shot  Needle Type: Echogenic Stimulator Needle     Needle Length: 5cm 5 cm Needle Gauge: 22 and 22 G    Additional Needles:  Procedures: ultrasound guided (picture in chart) and nerve stimulator Interscalene brachial plexus block  Nerve Stimulator or Paresthesia:  Response: bicep contraction, 0.45 mA,   Additional Responses:   Narrative:  Start time: 11/09/2013 11:33 AM End time: 11/09/2013 11:43 AM Injection made incrementally with aspirations every 5 mL.  Performed by: Personally  Anesthesiologist: J. Tamela Gammon, MD  Additional Notes: Functioning IV was confirmed and monitors applied.  A 51mm 22ga echogenic arrow stimulator was used. Sterile prep and drape,hand hygiene and sterile gloves were used.Ultrasound guidance: relevant anatomy identified, needle position confirmed, local anesthetic spread visualized around nerve(s)., vascular puncture avoided.  Image printed for medical record.  Negative aspiration and negative test dose prior to incremental administration of local anesthetic. The patient tolerated the procedure well.   Procedure Name: Intubation Date/Time: 11/09/2013 12:50 PM Performed by: Gaylene Brooks Pre-anesthesia Checklist: Patient identified, Emergency Drugs available, Suction available, Patient being monitored and Timeout performed Oxygen Delivery Method: Circle system utilized Preoxygenation: Pre-oxygenation with 100% oxygen Intubation Type: IV induction Ventilation: Mask ventilation without difficulty and Two handed mask ventilation  required Laryngoscope Size: Miller and 2 Grade View: Grade II Tube type: Oral Tube size: 7.5 mm Number of attempts: 1 Airway Equipment and Method: Stylet Placement Confirmation: ETT inserted through vocal cords under direct vision,  breath sounds checked- equal and bilateral,  CO2 detector and positive ETCO2 Secured at: 23 cm Tube secured with: Tape Dental Injury: Teeth and Oropharynx as per pre-operative assessment

## 2013-11-09 NOTE — Brief Op Note (Signed)
11/09/2013  2:59 PM  PATIENT:  Ulanda Edison  58 y.o. male  PRE-OPERATIVE DIAGNOSIS:   LEFT SHOULDER SLAP TEAR, AC ARTHROSIS   POST-OPERATIVE DIAGNOSIS:   LEFT SHOULDER SLAP TEAR, RCT,  AC ARTHROSIS   PROCEDURE:  Procedure(s): LEFT SHOULDER ARTHROSCOPY WITH SUBACROMIAL DECOMPRESSION AND OPEN BICEP TENODESIS, OPEN DISTAL CLAVICLE RESECTION ,open rotator cuff repair ,Debridement slap tear  (Left)  SURGEON:  Surgeon(s) and Role:    * Augustin Schooling, MD - Primary  PHYSICIAN ASSISTANT:   ASSISTANTS: Ventura Bruns, PA-C   ANESTHESIA:   regional and general  EBL:  Total I/O In: 1400 [I.V.:1400] Out: -   BLOOD ADMINISTERED:none  DRAINS: none   LOCAL MEDICATIONS USED:  MARCAINE     SPECIMEN:  No Specimen  DISPOSITION OF SPECIMEN:  N/A  COUNTS:  YES  TOURNIQUET:  * No tourniquets in log *  DICTATION: .Other Dictation: Dictation Number [id number]  PLAN OF CARE: Discharge to home after PACU  PATIENT DISPOSITION:  PACU - hemodynamically stable.   Delay start of Pharmacological VTE agent (>24hrs) due to surgical blood loss or risk of bleeding: not applicable

## 2013-11-09 NOTE — Progress Notes (Signed)
Orthopedic Tech Progress Note Patient Details:  Grant Hunter [birth year] [id number] Delivered to OR desk Ortho Devices Type of Ortho Device: Shoulder abduction pillow Ortho Device/Splint Interventions: Ordered   [place] 11/09/2013, 2:24 PM

## 2013-11-09 NOTE — Anesthesia Preprocedure Evaluation (Signed)
Anesthesia Evaluation  Patient identified by MRN, date of birth, ID band Patient awake    Reviewed: Allergy & Precautions, H&P , NPO status , Patient's Chart, lab work & pertinent test results  History of Anesthesia Complications Negative for: history of anesthetic complications  Airway Mallampati: II  Neck ROM: Full    Dental  (+) Teeth Intact, Dental Advisory Given   Pulmonary former smoker,    Pulmonary exam normal       Cardiovascular negative cardio ROS      Neuro/Psych negative neurological ROS  negative psych ROS   GI/Hepatic Neg liver ROS, GERD-  Medicated,  Endo/Other  negative endocrine ROS  Renal/GU negative Renal ROS     Musculoskeletal  (+) Arthritis -,   Abdominal   Peds  Hematology   Anesthesia Other Findings   Reproductive/Obstetrics                           Anesthesia Physical Anesthesia Plan  ASA: II  Anesthesia Plan: General   Post-op Pain Management:    Induction: Intravenous  Airway Management Planned: Oral ETT  Additional Equipment:   Intra-op Plan:   Post-operative Plan: Extubation in OR  Informed Consent: I have reviewed the patients History and Physical, chart, labs and discussed the procedure including the risks, benefits and alternatives for the proposed anesthesia with the patient or authorized representative who has indicated his/her understanding and acceptance.   Dental advisory given  Plan Discussed with: Anesthesiologist, CRNA and Surgeon  Anesthesia Plan Comments:         Anesthesia Quick Evaluation

## 2013-11-09 NOTE — Transfer of Care (Addendum)
Immediate Anesthesia Transfer of Care Note  Patient: Grant Hunter  Procedure(s) Performed: Procedure(s): LEFT SHOULDER ARTHROSCOPY WITH SUBACROMIAL DECOMPRESSION AND OPEN BICEP TENODESIS, OPEN DISTAL CLAVICLE RESECTION ,open rotator cuff repair ,Debridement slap tear  (Left)  Patient Location: PACU  Anesthesia Type:General  Level of Consciousness: awake, alert  and oriented  Airway & Oxygen Therapy: Patient connected to face mask oxygen  Post-op Assessment: Report given to PACU RN  Post vital signs: stable  Complications: No apparent anesthesia complications

## 2013-11-09 NOTE — Anesthesia Postprocedure Evaluation (Signed)
Anesthesia Post Note  Patient: Grant Hunter  Procedure(s) Performed: Procedure(s) (LRB): LEFT SHOULDER ARTHROSCOPY WITH SUBACROMIAL DECOMPRESSION AND OPEN BICEP TENODESIS, OPEN DISTAL CLAVICLE RESECTION ,open rotator cuff repair ,Debridement slap tear  (Left)  Anesthesia type: general  Patient location: PACU  Post pain: Pain level controlled  Post assessment: Patient's Cardiovascular Status Stable  Last Vitals:  Filed Vitals:   11/09/13 1527  BP:   Pulse: 82  Temp:   Resp: 18    Post vital signs: Reviewed and stable  Level of consciousness: sedated  Complications: No apparent anesthesia complications

## 2013-11-10 ENCOUNTER — Encounter (HOSPITAL_COMMUNITY): Payer: Self-pay | Admitting: Orthopedic Surgery

## 2013-11-10 NOTE — Op Note (Signed)
NAMEKIMANI, HOVIS                   ACCOUNT NO.:  [id number]  MEDICAL RECORD NO.:  [id number]  LOCATION:  [facility]                         FACILITY:  [place]  PHYSICIAN:  Doran Heater. Veverly Fells, M.D. DATE OF BIRTH:  02-Aug-1955  DATE OF PROCEDURE:  11/09/2013 DATE OF DISCHARGE:  11/09/2013                              OPERATIVE REPORT   PREOPERATIVE DIAGNOSIS:  Left shoulder SLAP tear and AC joint arthritis.  POSTOPERATIVE DIAGNOSIS: 1. Left shoulder SLAP tear. 2. Left shoulder rotator cuff tear. 3. Left shoulder AC joint arthritis, symptomatic.  PROCEDURE PERFORMED:  Left shoulder arthroscopy with extensive intra- articular debridement of torn superior labrum, anterior-posterior arthroscopic biceps tenotomy, arthroscopic subacromial decompression followed by mini open rotator cuff repair, biceps tenodesis in the groove, and open distal clavicle resection.  ATTENDING SURGEON:  Doran Heater. Veverly Fells, M.D.  ASSISTANT:  Abbott Pao. Dixon, PA-C, who scrubbed the entire procedure and necessary for satisfactory completion of surgery.  ANESTHESIA:  General anesthesia was used plus interscalene block.  ESTIMATED BLOOD LOSS:  Minimal.  FLUID REPLACEMENT:  1200 mL crystalloid.  INSTRUMENT COUNTS:  Correct.  COMPLICATIONS:  There were no complications.  ANTIBIOTICS:  Perioperative antibiotics were given.  INDICATIONS:  The patient is a 58 year old male with worsening left shoulder pain secondary to a CT arthrogram documented SLAP tear as well as advanced AC arthrosis without any impingement.  The patient failed an extended period of conservative management and desired operative treatment to restore function and to relieve pain in the shoulder. Informed consent obtained.  DESCRIPTION OF PROCEDURE:  After an adequate level of anesthesia achieved, the patient was positioned in modified beach-chair position, left shoulder correctly identified and examined under anesthesia.  The patient had full  passive range of motion with no undue stiffness or instability.  After sterile prep and drape of the shoulder and arm, time- out was called.  We entered the shoulder in standard portals using anterior, posterior, and lateral portals.  We identified significant tearing in superior labral biceps complex.  We performed a biceps tenotomy and labral debridement back to stable labral rim.  Anterior- inferior and posterior-inferior labrum intact.  Articular cartilage remarkably normal.  Very minimal chondromalacia noted on the central portion of the glenoid.  The rotator cuff had an undersurface partial- thickness tear with some slight fraying and it was unclear as to how deep that was and looked to be fairly superficial.  The scope was reversed and looking posteriorly, the posterior aspect of the cuff looked normal including infraspinatus and teres minor.  The posterior labrum looked intact.  After labral debridement and biceps tenotomy, we also inspected the subscapularis which appeared to be normal.  We placed the scope in subacromial space.  Thorough bursectomy and acromioplasty were performed creating a type 1 acromial shape with a butcher-block technique utilizing a high-speed bur.  The CA ligament was released. Rotator cuff inspected.  There was a soft spot which was about the posterior aspect of the supraspinatus tendon, likely consistent with a partial-thickness rotator cuff tear and basically an internal tear or interstitial tear.  At this point having concluded the arthroscopic portion of the procedure, we then made a small  saber incision overlying the AC joint.  Dissection down to subcutaneous tissues to the deltotrapezius fascia which was identified.  We divided that in line with distal clavicle.  Subperiosteal dissection of distal clavicle performed followed by excision of distal 2 mm to 3 mm using oscillating saw.  We thoroughly irrigated the AC interval followed by application  of bone wax to cut into the clavicle.  We then repaired deltotrapezius fascia with interrupted 0 Vicryl suture followed by 2-0 Vicryl subcutaneous closure and 4-0 Monocryl to skin.  We then addressed the SLAP tear in the rotator cuff to a mini open incision, started at the anterolateral border of the acromion staying distally about 3 cm to 4 cm.  Dissection down through the subcutaneous tissues, identified the deltoid raphe between the anterior and lateral heads and divided that sharply using needle-tip Bovie.  We placed Arthrex retractor, identified the biceps groove, delivered the biceps tendon out of the wound, prepared for the biceps groove with a needle-tip Bovie and a Soil scientist.  Then placed A Y-Knot Flex anchor to the floor of the biceps groove and brought that suture through the biceps tendon mid tension with the elbow at 90 degrees in a whipstitch fashion.  We performed a nice low-profile secure biceps tenodesis.  We were pleased with that and then went ahead and addressed the rotator cuff tear, was easily palpable just posterior to the anterior aspect of the supraspinatus tendon.  Big soft spot was incised in line with the rotator cuff fibers, immediately encountered tendinopathy and tearing of the tendon.  We removed some of that sharply using the knife.  Mobilized the tendon more from posteriorly to anteriorly and medial to lateral, placed #2 hi-fi suture in the free edge of the tendon, still to mobilize that with a Cobb elevator and to translate that a little bit medial to lateral.  We did side-to-side #2 hi-fi, also Y-Knot RC anchor double-loaded #2 hi-fi with mattress sutures and then took the free sutures through the posterior corner of that rotator cuff tear and then took that through a drill hole on the bone and the out the lateral humerus to further recreate the lateral portion the footprint.  Used 0 Vicryl to oversew a little bit side-to-side, pleased with that  repair.  We also used the suture from the biceps tenodesis and brought that up through the top of the entire repair with a mattress stitch to further reinforce and take pressure off of the repair sutures.  At this point, we ranged the shoulder, no impingement noted, no gapping or motion of the repair site.  We irrigated thoroughly the subdeltoid interval and repaired deltoid anatomically with 0 Vicryl sutures followed by 2-0 Vicryl subcutaneous closure and 4-0 Monocryl for skin and portals.  Steri-Strips applied followed by sterile dressing.  The patient tolerated the surgery well.     Doran Heater. Veverly Fells, M.D.     SRN/MEDQ  D:  11/09/2013  T:  11/10/2013  Job:  [id number]

## 2013-11-12 ENCOUNTER — Emergency Department (HOSPITAL_COMMUNITY)
Admission: EM | Admit: 2013-11-12 | Discharge: 2013-11-12 | Disposition: A | Discharge status: Home / Self Care | Payer: 59 | Attending: Emergency Medicine | Admitting: Emergency Medicine

## 2013-11-12 ENCOUNTER — Encounter (HOSPITAL_COMMUNITY): Payer: Self-pay | Admitting: Emergency Medicine

## 2013-11-12 ENCOUNTER — Emergency Department (HOSPITAL_COMMUNITY): Payer: 59

## 2013-11-12 DIAGNOSIS — R05 Cough: Secondary | ICD-10-CM | POA: Diagnosis not present

## 2013-11-12 DIAGNOSIS — M1990 Unspecified osteoarthritis, unspecified site: Secondary | ICD-10-CM | POA: Diagnosis not present

## 2013-11-12 DIAGNOSIS — Z87448 Personal history of other diseases of urinary system: Secondary | ICD-10-CM | POA: Diagnosis not present

## 2013-11-12 DIAGNOSIS — R062 Wheezing: Secondary | ICD-10-CM | POA: Insufficient documentation

## 2013-11-12 DIAGNOSIS — Z87891 Personal history of nicotine dependence: Secondary | ICD-10-CM | POA: Insufficient documentation

## 2013-11-12 DIAGNOSIS — Z86010 Personal history of colonic polyps: Secondary | ICD-10-CM | POA: Insufficient documentation

## 2013-11-12 DIAGNOSIS — J189 Pneumonia, unspecified organism: Secondary | ICD-10-CM | POA: Insufficient documentation

## 2013-11-12 DIAGNOSIS — R0602 Shortness of breath: Secondary | ICD-10-CM | POA: Insufficient documentation

## 2013-11-12 DIAGNOSIS — R059 Cough, unspecified: Secondary | ICD-10-CM

## 2013-11-12 DIAGNOSIS — K219 Gastro-esophageal reflux disease without esophagitis: Secondary | ICD-10-CM | POA: Insufficient documentation

## 2013-11-12 DIAGNOSIS — H548 Legal blindness, as defined in USA: Secondary | ICD-10-CM | POA: Insufficient documentation

## 2013-11-12 DIAGNOSIS — Z79899 Other long term (current) drug therapy: Secondary | ICD-10-CM | POA: Diagnosis not present

## 2013-11-12 LAB — BASIC METABOLIC PANEL
Anion gap: 10 (ref 5–15)
BUN: 7 mg/dL (ref 6–23)
CHLORIDE: 97 meq/L (ref 96–112)
CO2: 31 mEq/L (ref 19–32)
CREATININE: 0.8 mg/dL (ref 0.50–1.35)
Calcium: 9.1 mg/dL (ref 8.4–10.5)
GFR calc Af Amer: >90 mL/min (ref >90)
GFR calc non Af Amer: >90 mL/min (ref >90)
Glucose, Bld: 120 mg/dL — ABNORMAL HIGH (ref 70–99)
Potassium: 4.3 mEq/L (ref 3.7–5.3)
Sodium: 138 mEq/L (ref 137–147)

## 2013-11-12 LAB — CBC
HEMATOCRIT: 38.4 % — AB (ref 39.0–52.0)
Hemoglobin: 12.1 g/dL — ABNORMAL LOW (ref 13.0–17.0)
MCH: 28.9 pg (ref 26.0–34.0)
MCHC: 31.5 g/dL (ref 30.0–36.0)
MCV: 91.6 fL (ref 78.0–100.0)
Platelets: 225 10*3/uL (ref 150–400)
RBC: 4.19 MIL/uL — ABNORMAL LOW (ref 4.22–5.81)
RDW: 13.8 % (ref 11.5–15.5)
WBC: 5.6 10*3/uL (ref 4.0–10.5)

## 2013-11-12 MED ORDER — ALBUTEROL SULFATE HFA 108 (90 BASE) MCG/ACT IN AERS: 1.0000 | INHALATION_SPRAY | Freq: Four times a day (QID) | RESPIRATORY_TRACT | Status: DC | PRN

## 2013-11-12 MED ORDER — LEVOFLOXACIN 750 MG PO TABS: 750.0000 mg | ORAL_TABLET | Freq: Every day | ORAL | Status: DC

## 2013-11-12 NOTE — ED Provider Notes (Addendum)
CSN: [id number]     Arrival date & time 11/12/13  1722 History   First MD Initiated Contact with Patient 11/12/13 2035     Chief Complaint  Patient presents with  . Cough  . Post-op Problem     (Consider location/radiation/quality/duration/timing/severity/associated sxs/prior Treatment) HPI Comments: 58 -year-old male with arthritis, recent rotator cuff surgery on Monday presents with productive cough and congestion gradually worsening since surgery. Patient feels mild shortness of breath did not feel it significant. No heart or lung disease in the past.  Patient denies any unilateral leg swelling or leg pain, hemoptysis or active cancer.  Patient is a 58 y.o. male presenting with cough. The history is provided by the patient.  Cough Associated symptoms: shortness of breath   Associated symptoms: no chest pain, no chills, no fever, no headaches and no rash     Past Medical History  Diagnosis Date  . Arthritis     Rheumatoid arthritis  . Legally blind in left eye, as defined in [place]   . Pneumonia   . GERD (gastroesophageal reflux disease)   . Enlarged prostate   . Colon polyps    Past Surgical History  Procedure Laterality Date  . Tonsillectomy    . Eye surgery      parasitic infection left eye  . Colonoscopy    . Dental implants    . Shoulder arthroscopy with subacromial decompression and open rotator c Left 11/09/2013    Procedure: LEFT SHOULDER ARTHROSCOPY WITH SUBACROMIAL DECOMPRESSION AND OPEN BICEP TENODESIS, OPEN DISTAL CLAVICLE RESECTION ,open rotator cuff repair ,Debridement slap tear ;  Surgeon: Augustin Schooling, MD;  Location: [facility];  Service: Orthopedics;  Laterality: Left;   Family History  Problem Relation Age of Onset  . Breast cancer Mother    History  Substance Use Topics  . Smoking status: Former Smoker    Quit date: 11/06/1998  . Smokeless tobacco: Never Used  . Alcohol Use: Yes     Comment: social drinker    Review of Systems  Constitutional:  Negative for fever and chills.  HENT: Negative for congestion.   Eyes: Negative for visual disturbance.  Respiratory: Positive for cough and shortness of breath.   Cardiovascular: Negative for chest pain.  Gastrointestinal: Negative for vomiting and abdominal pain.  Genitourinary: Negative for dysuria and flank pain.  Musculoskeletal: Negative for back pain, neck pain and neck stiffness.  Skin: Negative for rash.  Neurological: Negative for syncope, light-headedness and headaches.      Allergies  Review of patient's allergies indicates no known allergies.  Home Medications   Prior to Admission medications   Medication Sig Start Date End Date Taking? Authorizing Provider  ALPRAZolam [name]) 0.5 MG tablet Take 0.5 mg by mouth 2 (two) times daily as needed for anxiety (anxiety).    Yes Historical Provider, MD  Buprenorphine (BUTRANS) 7.5 MCG/HR PTWK Place 7.5 mcg onto the skin once a week. Sunday   Yes Historical Provider, MD  CALCIUM-MAGNESIUM-ZINC PO Take 1 tablet by mouth daily.   Yes Historical Provider, MD  Cholecalciferol (VITAMIN D3) 2000 UNITS capsule Take 2,000 Units by mouth daily.   Yes Historical Provider, MD  finasteride (PROSCAR) 5 MG tablet Take 5 mg by mouth daily.   Yes Historical Provider, MD  KRILL OIL PO Take 1 tablet by mouth daily.   Yes Historical Provider, MD  methocarbamol (ROBAXIN) 500 MG tablet Take 500 mg by mouth every 8 (eight) hours as needed for muscle spasms (muscle spasms).  Yes Historical Provider, MD  omeprazole (PRILOSEC) 20 MG capsule Take 20 mg by mouth 2 (two) times daily before a meal.   Yes Historical Provider, MD  Oxycodone-Acetaminophen (ROXICET PO) Take 2 tablets by mouth every 4 (four) hours as needed (pain).   Yes Historical Provider, MD  Tofacitinib Citrate (XELJANZ) 5 MG TABS Take 5 mg by mouth 2 (two) times daily.   Yes Historical Provider, MD  traMADol (ULTRAM) 50 MG tablet Take 50 mg by mouth 2 (two) times daily.   Yes Historical  Provider, MD  VITAMIN E PO Take 1 tablet by mouth daily.   Yes Historical Provider, MD  albuterol (PROVENTIL HFA;VENTOLIN HFA) 108 (90 BASE) MCG/ACT inhaler Inhale 1-2 puffs into the lungs every 6 (six) hours as needed for wheezing or shortness of breath. 11/12/13   Mariea Clonts, MD  levofloxacin (LEVAQUIN) 750 MG tablet Take 1 tablet (750 mg total) by mouth daily. 11/12/13   Mariea Clonts, MD   BP 162/94  Pulse 86  Temp(Src) 98.1 F (36.7 C) (Oral)  Resp 18  SpO2 98% Physical Exam  Nursing note and vitals reviewed. Constitutional: He is oriented to person, place, and time. He appears well-developed and well-nourished.  HENT:  Head: Normocephalic and atraumatic.  Eyes: Conjunctivae are normal. Right eye exhibits no discharge. Left eye exhibits no discharge.  Neck: Normal range of motion. Neck supple. No tracheal deviation present.  Cardiovascular: Normal rate and regular rhythm.   Pulmonary/Chest: Effort normal. He has wheezes ( expiratory bilateral). He has rales (bases bilateral).  Abdominal: Soft. He exhibits no distension. There is no tenderness. There is no guarding.  Musculoskeletal: He exhibits no edema and no tenderness.  Neurological: He is alert and oriented to person, place, and time.  Skin: Skin is warm. No rash noted.  Psychiatric: He has a normal mood and affect.    ED Course  Procedures (including critical care time) Labs Review Labs Reviewed  BASIC METABOLIC PANEL - Abnormal; Notable for the following:    Glucose, Bld 120 (*)    All other components within normal limits  CBC - Abnormal; Notable for the following:    RBC 4.19 (*)    Hemoglobin 12.1 (*)    HCT 38.4 (*)    All other components within normal limits    Imaging Review Dg Chest 2 View (if Patient Has Fever And/or Copd)  11/12/2013   CLINICAL DATA:  Postoperative cough.  EXAM: CHEST  2 VIEW  COMPARISON:  06/10/2013.  FINDINGS: Trachea is midline. Heart size within normal limits. There may be  patchy opacities at the lung bases, with adjacent linear atelectasis. Lungs are otherwise clear. No pleural fluid.  IMPRESSION: Question new bibasilar patchy airspace disease, worrisome for developing pneumonia, with adjacent subsegmental atelectasis.   Electronically Signed   By: Lorin Picket M.D.   On: 11/12/2013 20:01     EKG Interpretation None      MDM   Final diagnoses:  Pneumonia, organism unspecified  Cough   Clinically patient presents with pneumonia postop. Vital signs unremarkable except for mild elevated blood pressure for which I discussed outpatient followup for reassessment. We discussed the differential diagnosis and with productive cough, lung exam consistent with pneumonia and chest x-ray consistent pneumonia this is most likely the diagnosis. I discussed that pulmonary embolism is on the differential patient got worse or did not improve with antibiotics he would need a CT scan to look for this. Patient and significant other are comfortable with  old and CT scan and close followup outpatient. CXR reviewed likely pneumonia. Results and differential diagnosis were discussed with the patient/parent/guardian. Close follow up outpatient was discussed, comfortable with the plan.   Medications - No data to display  Filed Vitals:   11/12/13 1819 11/12/13 2033  BP: 151/88 162/94  Pulse: 92 86  Temp: 98.2 F (36.8 C) 98.1 F (36.7 C)  TempSrc: Oral Oral  Resp: 18 18  SpO2: 100% 98%    Final diagnoses:  Pneumonia, organism unspecified  Cough        Mariea Clonts, MD 11/12/13 2115  Mariea Clonts, MD 11/12/13 2116

## 2013-11-12 NOTE — Discharge Instructions (Signed)
If you were given medicines take as directed.  If you are on coumadin or contraceptives realize their levels and effectiveness is altered by many different medicines.  If you have any reaction (rash, tongues swelling, other) to the medicines stop taking and see a physician.   Please follow up as directed and return to the ER or see a physician for new or worsening symptoms.  Thank you. Filed Vitals:   11/12/13 1819 11/12/13 2033  BP: 151/88 162/94  Pulse: 92 86  Temp: 98.2 F (36.8 C) 98.1 F (36.7 C)  TempSrc: Oral Oral  Resp: 18 18  SpO2: 100% 98%

## 2013-11-12 NOTE — ED Notes (Signed)
Pt states he had rotator cuff surgery on Monday.  States he has been coughing up yellow mucous since later that day after the surgery.  States he feels short winded.

## 2015-01-25 DIAGNOSIS — M159 Polyosteoarthritis, unspecified: Secondary | ICD-10-CM | POA: Diagnosis not present

## 2015-01-31 DIAGNOSIS — M159 Polyosteoarthritis, unspecified: Secondary | ICD-10-CM | POA: Diagnosis not present

## 2015-02-01 ENCOUNTER — Ambulatory Visit: Payer: 59 | Attending: Rheumatology

## 2015-02-01 DIAGNOSIS — M25531 Pain in right wrist: Secondary | ICD-10-CM | POA: Insufficient documentation

## 2015-02-01 DIAGNOSIS — M79642 Pain in left hand: Secondary | ICD-10-CM | POA: Insufficient documentation

## 2015-02-01 DIAGNOSIS — M069 Rheumatoid arthritis, unspecified: Secondary | ICD-10-CM | POA: Diagnosis not present

## 2015-02-01 DIAGNOSIS — M25561 Pain in right knee: Secondary | ICD-10-CM | POA: Diagnosis not present

## 2015-02-01 DIAGNOSIS — M79641 Pain in right hand: Secondary | ICD-10-CM | POA: Insufficient documentation

## 2015-02-01 DIAGNOSIS — M545 Low back pain, unspecified: Secondary | ICD-10-CM

## 2015-02-01 DIAGNOSIS — M25532 Pain in left wrist: Secondary | ICD-10-CM | POA: Diagnosis not present

## 2015-02-01 DIAGNOSIS — R6889 Other general symptoms and signs: Secondary | ICD-10-CM | POA: Insufficient documentation

## 2015-02-01 DIAGNOSIS — M79643 Pain in unspecified hand: Secondary | ICD-10-CM | POA: Diagnosis not present

## 2015-02-01 DIAGNOSIS — M25562 Pain in left knee: Secondary | ICD-10-CM | POA: Insufficient documentation

## 2015-02-01 NOTE — Therapy (Signed)
[place], [place], [address] Phone: [phone]   Fax:  [phone]  Physical Therapy Evaluation  Patient Details  Name: Grant Hunter MRN: [id number] Date of Birth: 24-Apr-1955 Referring Provider: T. Dossie Der, MD  Encounter Date: 02/01/2015      PT End of Session - 02/01/15 1306    Visit Number 1   Number of Visits 2   Date for PT Re-Evaluation --  Not set until BP under control   Authorization Type UMR   PT Start Time 1105   PT Stop Time 1250   PT Time Calculation (min) 105 min   Activity Tolerance --  Limited by elevated BP   Behavior During Therapy WFL for tasks assessed/performed      Past Medical History  Diagnosis Date  . Arthritis     Rheumatoid arthritis  . Legally blind in left eye, as defined in [place]   . Pneumonia   . GERD (gastroesophageal reflux disease)   . Enlarged prostate   . Colon polyps     Past Surgical History  Procedure Laterality Date  . Tonsillectomy    . Eye surgery      parasitic infection left eye  . Colonoscopy    . Dental implants    . Shoulder arthroscopy with subacromial decompression and open rotator c Left 11/09/2013    Procedure: LEFT SHOULDER ARTHROSCOPY WITH SUBACROMIAL DECOMPRESSION AND OPEN BICEP TENODESIS, OPEN DISTAL CLAVICLE RESECTION ,open rotator cuff repair ,Debridement slap tear ;  Surgeon: Augustin Schooling, MD;  Location: [facility];  Service: Orthopedics;  Laterality: Left;    There were no vitals filed for this visit.  Visit Diagnosis:  Rheumatoid arthritis involving shoulder, unspecified laterality, unspecified rheumatoid factor presence (HCC)  Pain in right knee  Activity intolerance  Midline low back pain without sciatica  Pain of hand, unspecified laterality  Pain in both wrists      Subjective Assessment - 02/01/15 1300    Subjective Mr Beitzel has hand , back and , RT knee pain due to RA and OA. 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 He reports though he can do activity some days he is not able to perform tasks on other days.    Patient Stated Goals FCE for disability  application   Currently in Pain? Yes   Pain Score 2    Pain Location Back  hands and wrists and RT knee            [facility] PT Assessment - 02/01/15 1105    Assessment   Medical Diagnosis RA   Referring Provider T. Dossie Der, MD     Mr Manwell started with elevated BP of 170/110. We proceeded as he reported he normally had normal BP. After doing the floor to waist lifting activity his BP was 158/100. As this was better we did the stair climbing activity. His BP after this was 170/100. After a short rest we proceeded to the repetative squatting activity and his BP was 180/98. He rested 5 min and BP was 170/100, rested another 3 min and his BP was 165/108. The testing was stopped due to consistent BP numbers above the testing protocol limits of  BP no higher than 150/100. He will be rescheduled after his BP is more stable. His wife will monitor over next 2-3 days and if lower will try again  next week. And if not he will need to be cleared by MD to return.                       PT Education - 02/01/15 1305    Education provided Yes   Education Details Risks fo doing heavy activity with elevated BP   Person(s) Educated Patient   Methods Explanation   Comprehension Verbalized understanding                    Plan - 02/01/15 1307    Clinical Impression Statement Mr Librizzi was able to participate with the FCE process with mild to moderate pain. but elevated BP stopped the testing   PT Next Visit Plan When Mr Wesch's BP is better controlled I will reschedule his testing.    Consulted and Agree with Plan of Care Patient         Problem List There are no active problems to display for this patient.   Darrel Hoover PT 02/01/2015,  1:12 PM  [facility] [address] [place], [place], [address] Phone: [phone]   Fax:  [phone]  Name: JADARION VERBEKE MRN: [id number] Date of Birth: 10-Jul-1955

## 2015-02-01 NOTE — Patient Instructions (Signed)
Grant Hunter was asked that his wife monitor BP over next 2-3 days and report back to me.

## 2015-02-08 ENCOUNTER — Ambulatory Visit: Payer: 59

## 2015-02-08 DIAGNOSIS — R6889 Other general symptoms and signs: Secondary | ICD-10-CM | POA: Diagnosis not present

## 2015-02-08 DIAGNOSIS — M25562 Pain in left knee: Principal | ICD-10-CM

## 2015-02-08 DIAGNOSIS — M79641 Pain in right hand: Secondary | ICD-10-CM | POA: Diagnosis not present

## 2015-02-08 DIAGNOSIS — M79642 Pain in left hand: Secondary | ICD-10-CM

## 2015-02-08 DIAGNOSIS — M545 Low back pain, unspecified: Secondary | ICD-10-CM

## 2015-02-08 DIAGNOSIS — M25532 Pain in left wrist: Secondary | ICD-10-CM

## 2015-02-08 DIAGNOSIS — M25531 Pain in right wrist: Secondary | ICD-10-CM | POA: Diagnosis not present

## 2015-02-08 DIAGNOSIS — M25561 Pain in right knee: Secondary | ICD-10-CM | POA: Diagnosis not present

## 2015-02-08 DIAGNOSIS — M79643 Pain in unspecified hand: Secondary | ICD-10-CM | POA: Diagnosis not present

## 2015-02-08 DIAGNOSIS — M069 Rheumatoid arthritis, unspecified: Secondary | ICD-10-CM | POA: Diagnosis not present

## 2015-02-08 NOTE — Therapy (Signed)
[place], [place], [address] Phone: [phone]   Fax:  [phone]  Physical Therapy Evaluation  Patient Details  Name: Grant Hunter MRN: [id number] Date of Birth: 19-Jan-1956 Referring Provider: Sabino Niemann , MD  Encounter Date: 02/08/2015      PT End of Session - 02/08/15 1644    Visit Number 2   Number of Visits 2   PT Start Time 1230   PT Stop Time 1545   PT Time Calculation (min) 195 min   Activity Tolerance Patient tolerated treatment well;Patient limited by pain   Behavior During Therapy [facility] for tasks assessed/performed      Past Medical History  Diagnosis Date  . Arthritis     Rheumatoid arthritis  . Legally blind in left eye, as defined in [place]   . Pneumonia   . GERD (gastroesophageal reflux disease)   . Enlarged prostate   . Colon polyps     Past Surgical History  Procedure Laterality Date  . Tonsillectomy    . Eye surgery      parasitic infection left eye  . Colonoscopy    . Dental implants    . Shoulder arthroscopy with subacromial decompression and open rotator c Left 11/09/2013    Procedure: LEFT SHOULDER ARTHROSCOPY WITH SUBACROMIAL DECOMPRESSION AND OPEN BICEP TENODESIS, OPEN DISTAL CLAVICLE RESECTION ,open rotator cuff repair ,Debridement slap tear ;  Surgeon: Augustin Schooling, MD;  Location: [facility];  Service: Orthopedics;  Laterality: Left;    There were no vitals filed for this visit.  Visit Diagnosis:  Arthralgia of both knees - Plan: PT plan of care cert/re-cert  Bilateral low back pain without sciatica - Plan: PT plan of care cert/re-cert  Pain in both hands - Plan: PT plan of care cert/re-cert  Pain in both wrists - Plan: PT plan of care cert/re-cert  Rheumatoid arthritis involving shoulder, unspecified laterality, unspecified rheumatoid factor presence ([place]) - Plan: PT plan of care cert/re-cert      Subjective Assessment - 02/08/15 1637    Subjective Grant Hunter  has hand , back and , RT knee pain due to RA and OA. 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 He reports though he can do activity some days he is not able to perform tasks on other days.    Patient Stated Goals FCE for disability  application   Currently in Pain? Yes  See FCE in EPIC for pin during FCE            [facility] PT Assessment - 02/08/15 0001    Assessment   Medical Diagnosis RA   Referring Provider Sabino Niemann , MD   Precautions   Precautions None   Restrictions   Weight Bearing Restrictions No   Prior Function   Level of Independence Independent   Cognition   Overall Cognitive Status Within Functional Limits for tasks assessed      FCE report scanned into EPIC. Copy faxed to Dr Dossie Der.                     PT Education - 02/08/15 1640    Education provided Yes   Education Details Reviewed  results of FCE with Grant Hunter) Educated Patient   Methods Explanation   Comprehension Verbalized understanding  Plan - 02/08/15 1640    Clinical Impression Statement Grant Hunter completed the FCE process and was rated at medium level for work. See report scanned in EPIC   PT Next Visit Plan NO follow up with me. He will return to Dr Dossie Der for follow up   Consulted and Agree with Plan of Care Patient         Problem List There are no active problems to display for this patient.   Darrel Hoover PT 02/08/2015, 4:48 PM  [facility] [address] [place], [place], [address] Phone: [phone]   Fax:  [phone]  Name: Grant Hunter MRN: [id number] Date of Birth: [birth year]

## 2015-02-15 MED FILL — OMEPRAZOLE DR 20 MG CAPSULE: 20 | 30 days supply | Qty: 60 | Fill #2

## 2015-02-15 MED FILL — CIALIS 5 MG TABLET: 5 | 30 days supply | Qty: 30 | Fill #3

## 2015-02-16 DIAGNOSIS — Z79899 Other long term (current) drug therapy: Secondary | ICD-10-CM | POA: Diagnosis not present

## 2015-02-16 DIAGNOSIS — M179 Osteoarthritis of knee, unspecified: Secondary | ICD-10-CM | POA: Diagnosis not present

## 2015-02-16 DIAGNOSIS — M159 Polyosteoarthritis, unspecified: Secondary | ICD-10-CM | POA: Diagnosis not present

## 2015-02-16 DIAGNOSIS — M0579 Rheumatoid arthritis with rheumatoid factor of multiple sites without organ or systems involvement: Secondary | ICD-10-CM | POA: Diagnosis not present

## 2015-02-16 MED FILL — ALPRAZolam 0.5 MG TABS: 0.5 | 30 days supply | Qty: 120 | Fill #0

## 2015-02-16 MED FILL — BUTRANS 15 MCG/HR PATCH: 15 | 28 days supply | Qty: 4 | Fill #0

## 2015-02-17 MED FILL — LEFLUNOMIDE 10 MG TABLET: 10 | 30 days supply | Qty: 60 | Fill #0

## 2015-02-17 MED FILL — METHYLPREDNISOLONE 4 MG TAB: 4 | 6 days supply | Qty: 21 | Fill #0

## 2015-03-02 MED FILL — ACTEMRA 200 MG/10 ML VIAL: 200 | 30 days supply | Qty: 10 | Fill #0

## 2015-03-02 MED FILL — ACTEMRA 80 MG/4 ML VIAL: 80 | 2 days supply | Qty: 8 | Fill #0

## 2015-03-07 DIAGNOSIS — M0579 Rheumatoid arthritis with rheumatoid factor of multiple sites without organ or systems involvement: Secondary | ICD-10-CM | POA: Diagnosis not present

## 2015-03-08 DIAGNOSIS — M159 Polyosteoarthritis, unspecified: Secondary | ICD-10-CM | POA: Diagnosis not present

## 2015-03-08 DIAGNOSIS — M0579 Rheumatoid arthritis with rheumatoid factor of multiple sites without organ or systems involvement: Secondary | ICD-10-CM | POA: Diagnosis not present

## 2015-03-08 DIAGNOSIS — Z79899 Other long term (current) drug therapy: Secondary | ICD-10-CM | POA: Diagnosis not present

## 2015-03-16 MED FILL — BUTRANS 15 MCG/HR PATCH: 15 | 28 days supply | Qty: 4 | Fill #1

## 2015-03-16 MED FILL — traMADol HCL 50 MG TABS: 50 | 30 days supply | Qty: 60 | Fill #0

## 2015-03-16 MED FILL — LEFLUNOMIDE 10 MG TABLET: 10 | 30 days supply | Qty: 60 | Fill #1

## 2015-03-16 MED FILL — OMEPRAZOLE DR 20 MG CAPSULE: 20 | 30 days supply | Qty: 60 | Fill #3

## 2015-03-16 MED FILL — ALPRAZolam 0.5 MG TABS: 0.5 | 30 days supply | Qty: 120 | Fill #1

## 2015-03-16 MED FILL — CIALIS 5 MG TABLET: 5 | 30 days supply | Qty: 30 | Fill #4

## 2015-03-31 MED FILL — ACTEMRA 80 MG/4 ML VIAL: 80 | 2 days supply | Qty: 8 | Fill #1

## 2015-03-31 MED FILL — ACTEMRA 200 MG/10 ML VIAL: 200 | 30 days supply | Qty: 10 | Fill #1

## 2015-04-04 DIAGNOSIS — M0579 Rheumatoid arthritis with rheumatoid factor of multiple sites without organ or systems involvement: Secondary | ICD-10-CM | POA: Diagnosis not present

## 2015-04-13 MED FILL — ALPRAZolam 0.5 MG TABS: 0.5 | 30 days supply | Qty: 120 | Fill #2

## 2015-04-13 MED FILL — CIALIS 5 MG TABLET: 5 | 30 days supply | Qty: 30 | Fill #5

## 2015-04-13 MED FILL — OMEPRAZOLE DR 20 MG CAPSULE: 20 | 30 days supply | Qty: 60 | Fill #4

## 2015-04-14 MED FILL — traMADol HCL 50 MG TABS: 50 | 22 days supply | Qty: 90 | Fill #0

## 2015-04-14 MED FILL — BUTRANS 15 MCG/HR PATCH: 15 | 28 days supply | Qty: 4 | Fill #0

## 2015-04-25 MED FILL — LEFLUNOMIDE 10 MG TABLET: 10 | 30 days supply | Qty: 60 | Fill #2

## 2015-04-29 MED FILL — ACTEMRA 80 MG/4 ML VIAL: 80 | 2 days supply | Qty: 8 | Fill #2

## 2015-04-29 MED FILL — ACTEMRA 200 MG/10 ML VIAL: 200 | 30 days supply | Qty: 10 | Fill #2

## 2015-05-02 DIAGNOSIS — M0579 Rheumatoid arthritis with rheumatoid factor of multiple sites without organ or systems involvement: Secondary | ICD-10-CM | POA: Diagnosis not present

## 2015-05-12 MED FILL — OMEPRAZOLE DR 20 MG CAPSULE: 20 | 30 days supply | Qty: 60 | Fill #5

## 2015-05-16 MED FILL — BUTRANS 15 MCG/HR PATCH: 15 | 28 days supply | Qty: 4 | Fill #0

## 2015-05-17 MED FILL — traMADol HCL 50 MG TABS: 50 | 22 days supply | Qty: 90 | Fill #0

## 2015-05-18 MED FILL — ALPRAZolam 0.5 MG TABS: 0.5 | 30 days supply | Qty: 120 | Fill #0

## 2015-05-18 MED FILL — CIALIS 5 MG TABLET: 5 | 30 days supply | Qty: 30 | Fill #0

## 2015-05-19 DIAGNOSIS — M150 Primary generalized (osteo)arthritis: Secondary | ICD-10-CM | POA: Diagnosis not present

## 2015-05-19 DIAGNOSIS — Z79899 Other long term (current) drug therapy: Secondary | ICD-10-CM | POA: Diagnosis not present

## 2015-05-19 DIAGNOSIS — M25561 Pain in right knee: Secondary | ICD-10-CM | POA: Diagnosis not present

## 2015-05-19 DIAGNOSIS — M0579 Rheumatoid arthritis with rheumatoid factor of multiple sites without organ or systems involvement: Secondary | ICD-10-CM | POA: Diagnosis not present

## 2015-05-20 DIAGNOSIS — Z0000 Encounter for general adult medical examination without abnormal findings: Secondary | ICD-10-CM | POA: Diagnosis not present

## 2015-06-01 DIAGNOSIS — M0579 Rheumatoid arthritis with rheumatoid factor of multiple sites without organ or systems involvement: Secondary | ICD-10-CM | POA: Diagnosis not present

## 2015-06-02 DIAGNOSIS — Z0000 Encounter for general adult medical examination without abnormal findings: Secondary | ICD-10-CM | POA: Diagnosis not present

## 2015-06-07 MED FILL — BUTRANS 15 MCG/HR PATCH: 15 | 28 days supply | Qty: 4 | Fill #0

## 2015-06-07 MED FILL — LEFLUNOMIDE 20 MG TABLET: 20 | 30 days supply | Qty: 30 | Fill #0

## 2015-06-14 MED FILL — OMEPRAZOLE DR 20 MG CAPSULE: 20 | 30 days supply | Qty: 60 | Fill #0

## 2015-06-14 MED FILL — CIALIS 5 MG TABLET: 5 | 30 days supply | Qty: 30 | Fill #0

## 2015-06-15 MED FILL — ALPRAZolam 0.5 MG TABS: 0.5 | 30 days supply | Qty: 120 | Fill #0

## 2015-06-29 DIAGNOSIS — M0579 Rheumatoid arthritis with rheumatoid factor of multiple sites without organ or systems involvement: Secondary | ICD-10-CM | POA: Diagnosis not present

## 2015-06-29 DIAGNOSIS — Z79899 Other long term (current) drug therapy: Secondary | ICD-10-CM | POA: Diagnosis not present

## 2015-07-06 MED FILL — BUTRANS 15 MCG/HR PATCH: 15 | 28 days supply | Qty: 4 | Fill #1

## 2015-07-11 MED FILL — OMEPRAZOLE DR 20 MG CAPSULE: 20 | 30 days supply | Qty: 60 | Fill #1

## 2015-07-11 MED FILL — CIALIS 5 MG TABLET: 5 | 30 days supply | Qty: 30 | Fill #0

## 2015-07-18 MED FILL — traMADol HCL 50 MG TABS: 50 | 22 days supply | Qty: 90 | Fill #0

## 2015-07-18 MED FILL — ALPRAZolam 0.5 MG TABS: 0.5 | 30 days supply | Qty: 120 | Fill #1

## 2015-07-18 MED FILL — LEFLUNOMIDE 20 MG TABLET: 20 | 30 days supply | Qty: 30 | Fill #1

## 2015-07-28 DIAGNOSIS — M0579 Rheumatoid arthritis with rheumatoid factor of multiple sites without organ or systems involvement: Secondary | ICD-10-CM | POA: Diagnosis not present

## 2015-08-01 MED FILL — BUPRENORPHINE 15 MCG/HR PAT: 15 | 28 days supply | Qty: 4 | Fill #2

## 2015-08-15 MED FILL — ALPRAZolam 0.5 MG TABS: 0.5 | 30 days supply | Qty: 120 | Fill #2

## 2015-08-15 MED FILL — LEFLUNOMIDE 20 MG TABLET: 20 | 30 days supply | Qty: 30 | Fill #2

## 2015-08-15 MED FILL — OMEPRAZOLE DR 20 MG CAPSULE: 20 | 30 days supply | Qty: 60 | Fill #2

## 2015-08-15 MED FILL — traMADol HCL 50 MG TABS: 50 | 22 days supply | Qty: 90 | Fill #1

## 2015-08-15 MED FILL — CIALIS 5 MG TABLET: 5 | 30 days supply | Qty: 30 | Fill #1

## 2015-08-18 DIAGNOSIS — M150 Primary generalized (osteo)arthritis: Secondary | ICD-10-CM | POA: Diagnosis not present

## 2015-08-18 DIAGNOSIS — G894 Chronic pain syndrome: Secondary | ICD-10-CM | POA: Diagnosis not present

## 2015-08-18 DIAGNOSIS — M25561 Pain in right knee: Secondary | ICD-10-CM | POA: Diagnosis not present

## 2015-08-18 DIAGNOSIS — Z79899 Other long term (current) drug therapy: Secondary | ICD-10-CM | POA: Diagnosis not present

## 2015-08-18 DIAGNOSIS — M0579 Rheumatoid arthritis with rheumatoid factor of multiple sites without organ or systems involvement: Secondary | ICD-10-CM | POA: Diagnosis not present

## 2015-08-18 MED FILL — MELOXICAM 7.5 MG TABLET: 7.5 | 30 days supply | Qty: 60 | Fill #0

## 2015-08-19 MED FILL — BUPRENORPHINE 10 MCG/HR PAT: 10 | 28 days supply | Qty: 4 | Fill #0

## 2015-08-25 DIAGNOSIS — M0579 Rheumatoid arthritis with rheumatoid factor of multiple sites without organ or systems involvement: Secondary | ICD-10-CM | POA: Diagnosis not present

## 2015-09-05 MED FILL — LEFLUNOMIDE 20 MG TABLET: 20 | 30 days supply | Qty: 30 | Fill #0

## 2015-09-12 MED FILL — OMEPRAZOLE DR 20 MG CAPSULE: 20 | 30 days supply | Qty: 60 | Fill #3

## 2015-09-12 MED FILL — ALPRAZolam 0.5 MG TABS: 0.5 | 30 days supply | Qty: 120 | Fill #3

## 2015-09-12 MED FILL — MELOXICAM 7.5 MG TABLET: 7.5 | 30 days supply | Qty: 60 | Fill #1

## 2015-09-12 MED FILL — CIALIS 5 MG TABLET: 5 | 30 days supply | Qty: 30 | Fill #2

## 2015-09-13 MED FILL — traMADol HCL 50 MG TABS: 50 | 22 days supply | Qty: 90 | Fill #0

## 2015-09-14 MED FILL — BUPRENORPHINE 10 MCG/HR PAT: 10 | 28 days supply | Qty: 4 | Fill #1

## 2015-09-22 DIAGNOSIS — Z79899 Other long term (current) drug therapy: Secondary | ICD-10-CM | POA: Diagnosis not present

## 2015-09-22 DIAGNOSIS — M0579 Rheumatoid arthritis with rheumatoid factor of multiple sites without organ or systems involvement: Secondary | ICD-10-CM | POA: Diagnosis not present

## 2015-10-03 DIAGNOSIS — M1711 Unilateral primary osteoarthritis, right knee: Secondary | ICD-10-CM | POA: Diagnosis not present

## 2015-10-10 DIAGNOSIS — M1711 Unilateral primary osteoarthritis, right knee: Secondary | ICD-10-CM | POA: Diagnosis not present

## 2015-10-12 MED FILL — ALPRAZolam 0.5 MG TABS: 0.5 | 30 days supply | Qty: 120 | Fill #4

## 2015-10-12 MED FILL — CIALIS 5 MG TABLET: 5 | 30 days supply | Qty: 30 | Fill #3

## 2015-10-12 MED FILL — traMADol HCL 50 MG TABS: 50 | 22 days supply | Qty: 90 | Fill #1

## 2015-10-12 MED FILL — BUPRENORPHINE 10 MCG/HR PAT: 10 | 28 days supply | Qty: 4 | Fill #2

## 2015-10-12 MED FILL — OMEPRAZOLE DR 20 MG CAPSULE: 20 | 30 days supply | Qty: 60 | Fill #4

## 2015-10-12 MED FILL — LEFLUNOMIDE 20 MG TABLET: 20 | 30 days supply | Qty: 30 | Fill #1

## 2015-10-13 MED FILL — MELOXICAM 7.5 MG TABLET: 7.5 | 30 days supply | Qty: 60 | Fill #0

## 2015-10-21 DIAGNOSIS — I10 Essential (primary) hypertension: Secondary | ICD-10-CM | POA: Diagnosis not present

## 2015-10-21 DIAGNOSIS — M0579 Rheumatoid arthritis with rheumatoid factor of multiple sites without organ or systems involvement: Secondary | ICD-10-CM | POA: Diagnosis not present

## 2015-10-21 DIAGNOSIS — M1711 Unilateral primary osteoarthritis, right knee: Secondary | ICD-10-CM | POA: Diagnosis not present

## 2015-11-07 MED FILL — MELOXICAM 7.5 MG TABLET: 7.5 | 30 days supply | Qty: 60 | Fill #0

## 2015-11-07 MED FILL — OMEPRAZOLE 20 MG CAPSULE DR: 20 | 30 days supply | Qty: 60 | Fill #5

## 2015-11-07 MED FILL — BUPRENORPHINE 10 MCG/HR PAT: 10 | 28 days supply | Qty: 4 | Fill #0

## 2015-11-07 MED FILL — CIALIS 5 MG TABLET: 5 | 30 days supply | Qty: 30 | Fill #4

## 2015-11-07 MED FILL — LEFLUNOMIDE 20 MG TABLET: 20 | 30 days supply | Qty: 30 | Fill #2

## 2015-11-07 MED FILL — traMADol HCL 50 MG TABS: 50 | 22 days supply | Qty: 90 | Fill #0

## 2015-11-09 MED FILL — ALPRAZolam 0.5 MG TABS: 0.5 | 30 days supply | Qty: 120 | Fill #5

## 2015-11-18 DIAGNOSIS — M0579 Rheumatoid arthritis with rheumatoid factor of multiple sites without organ or systems involvement: Secondary | ICD-10-CM | POA: Diagnosis not present

## 2015-12-01 MED FILL — traMADol HCL 50 MG TABS: 50 | 22 days supply | Qty: 90 | Fill #1

## 2015-12-01 MED FILL — CIALIS 5 MG TABLET: 5 | 30 days supply | Qty: 30 | Fill #5

## 2015-12-01 MED FILL — LEFLUNOMIDE 20 MG TABLET: 20 | 30 days supply | Qty: 30 | Fill #0

## 2015-12-01 MED FILL — OMEPRAZOLE 20 MG CAPSULE DR: 20 | 30 days supply | Qty: 60 | Fill #0

## 2015-12-01 MED FILL — MELOXICAM 7.5 MG TABLET: 7.5 | 30 days supply | Qty: 60 | Fill #0

## 2015-12-16 DIAGNOSIS — Z0000 Encounter for general adult medical examination without abnormal findings: Secondary | ICD-10-CM | POA: Diagnosis not present

## 2015-12-19 DIAGNOSIS — M0579 Rheumatoid arthritis with rheumatoid factor of multiple sites without organ or systems involvement: Secondary | ICD-10-CM | POA: Diagnosis not present

## 2015-12-19 MED FILL — BUPRENORPHINE 10 MCG/HR PAT: 10 | 28 days supply | Qty: 4 | Fill #1

## 2015-12-21 DIAGNOSIS — R7301 Impaired fasting glucose: Secondary | ICD-10-CM | POA: Diagnosis not present

## 2015-12-21 DIAGNOSIS — M0579 Rheumatoid arthritis with rheumatoid factor of multiple sites without organ or systems involvement: Secondary | ICD-10-CM | POA: Diagnosis not present

## 2015-12-21 DIAGNOSIS — Z23 Encounter for immunization: Secondary | ICD-10-CM | POA: Diagnosis not present

## 2015-12-21 DIAGNOSIS — E782 Mixed hyperlipidemia: Secondary | ICD-10-CM | POA: Diagnosis not present

## 2015-12-21 MED FILL — ATORVASTATIN 10 MG TABLET: 10 | 30 days supply | Qty: 30 | Fill #0

## 2015-12-21 MED FILL — ALPRAZolam 0.5 MG TABS: 0.5 | 30 days supply | Qty: 120 | Fill #0

## 2015-12-26 MED FILL — LEFLUNOMIDE 20 MG TABLET: 20 | 30 days supply | Qty: 30 | Fill #1

## 2015-12-26 MED FILL — MELOXICAM 7.5 MG TABLET: 7.5 | 30 days supply | Qty: 60 | Fill #0

## 2015-12-26 MED FILL — traMADol HCL 50 MG TABS: 50 | 22 days supply | Qty: 90 | Fill #0

## 2015-12-26 MED FILL — OMEPRAZOLE 20 MG CAPSULE DR: 20 | 30 days supply | Qty: 60 | Fill #0

## 2015-12-26 MED FILL — CIALIS 5 MG TABLET: 5 | 30 days supply | Qty: 30 | Fill #0

## 2016-01-10 DIAGNOSIS — H5201 Hypermetropia, right eye: Secondary | ICD-10-CM | POA: Diagnosis not present

## 2016-01-10 DIAGNOSIS — Z970 Presence of artificial eye: Secondary | ICD-10-CM | POA: Diagnosis not present

## 2016-01-10 DIAGNOSIS — H52221 Regular astigmatism, right eye: Secondary | ICD-10-CM | POA: Diagnosis not present

## 2016-01-18 DIAGNOSIS — M0579 Rheumatoid arthritis with rheumatoid factor of multiple sites without organ or systems involvement: Secondary | ICD-10-CM | POA: Diagnosis not present

## 2016-01-19 DIAGNOSIS — M0579 Rheumatoid arthritis with rheumatoid factor of multiple sites without organ or systems involvement: Secondary | ICD-10-CM | POA: Diagnosis not present

## 2016-01-19 DIAGNOSIS — M7021 Olecranon bursitis, right elbow: Secondary | ICD-10-CM | POA: Diagnosis not present

## 2016-01-19 DIAGNOSIS — Z6830 Body mass index (BMI) 30.0-30.9, adult: Secondary | ICD-10-CM | POA: Diagnosis not present

## 2016-01-19 DIAGNOSIS — G894 Chronic pain syndrome: Secondary | ICD-10-CM | POA: Diagnosis not present

## 2016-01-19 DIAGNOSIS — M150 Primary generalized (osteo)arthritis: Secondary | ICD-10-CM | POA: Diagnosis not present

## 2016-01-19 DIAGNOSIS — Z79899 Other long term (current) drug therapy: Secondary | ICD-10-CM | POA: Diagnosis not present

## 2016-01-19 DIAGNOSIS — E669 Obesity, unspecified: Secondary | ICD-10-CM | POA: Diagnosis not present

## 2016-01-20 MED FILL — traMADol HCL 50 MG TABS: 50 | 22 days supply | Qty: 90 | Fill #1

## 2016-01-20 MED FILL — CIALIS 5 MG TABLET: 5 | 30 days supply | Qty: 30 | Fill #1

## 2016-01-20 MED FILL — BUPRENORPHINE 10 MCG/HR PAT: 10 | 28 days supply | Qty: 4 | Fill #2

## 2016-01-20 MED FILL — OMEPRAZOLE 20 MG CAPSULE DR: 20 | 30 days supply | Qty: 60 | Fill #1

## 2016-01-20 MED FILL — ALPRAZolam 0.5 MG TABS: 0.5 | 30 days supply | Qty: 120 | Fill #1

## 2016-01-20 MED FILL — ATORVASTATIN 10 MG TABLET: 10 | 30 days supply | Qty: 30 | Fill #1

## 2016-02-15 DIAGNOSIS — Z79899 Other long term (current) drug therapy: Secondary | ICD-10-CM | POA: Diagnosis not present

## 2016-02-15 DIAGNOSIS — M0579 Rheumatoid arthritis with rheumatoid factor of multiple sites without organ or systems involvement: Secondary | ICD-10-CM | POA: Diagnosis not present

## 2016-02-17 MED FILL — ATORVASTATIN 10 MG TABLET: 10 | 30 days supply | Qty: 30 | Fill #2

## 2016-02-17 MED FILL — OMEPRAZOLE 20 MG CAPSULE DR: 20 | 30 days supply | Qty: 60 | Fill #2

## 2016-02-17 MED FILL — traMADol HCL 50 MG TABS: 50 | 22 days supply | Qty: 90 | Fill #0

## 2016-02-17 MED FILL — CIALIS 5 MG TABLET: 5 | 30 days supply | Qty: 30 | Fill #2

## 2016-02-17 MED FILL — ALPRAZolam 0.5 MG TABS: 0.5 | 30 days supply | Qty: 120 | Fill #2

## 2016-02-20 MED FILL — BUPRENORPHINE 10 MCG/HR PAT: 10 | 28 days supply | Qty: 4 | Fill #0

## 2016-03-13 MED FILL — CIALIS 5 MG TABLET: 5 | 30 days supply | Qty: 30 | Fill #3

## 2016-03-13 MED FILL — OMEPRAZOLE 20 MG CAPSULE DR: 20 | 30 days supply | Qty: 60 | Fill #3

## 2016-03-13 MED FILL — traMADol HCL 50 MG TABS: 50 | 22 days supply | Qty: 90 | Fill #1

## 2016-03-13 MED FILL — BUPRENORPHINE 10 MCG/HR PAT: 10 | 28 days supply | Qty: 4 | Fill #1

## 2016-03-13 MED FILL — ATORVASTATIN 10 MG TABLET: 10 | 30 days supply | Qty: 30 | Fill #3

## 2016-03-14 DIAGNOSIS — M0579 Rheumatoid arthritis with rheumatoid factor of multiple sites without organ or systems involvement: Secondary | ICD-10-CM | POA: Diagnosis not present

## 2016-03-15 DIAGNOSIS — Z6830 Body mass index (BMI) 30.0-30.9, adult: Secondary | ICD-10-CM | POA: Diagnosis not present

## 2016-03-15 DIAGNOSIS — M0579 Rheumatoid arthritis with rheumatoid factor of multiple sites without organ or systems involvement: Secondary | ICD-10-CM | POA: Diagnosis not present

## 2016-03-15 DIAGNOSIS — Z79899 Other long term (current) drug therapy: Secondary | ICD-10-CM | POA: Diagnosis not present

## 2016-03-15 DIAGNOSIS — M150 Primary generalized (osteo)arthritis: Secondary | ICD-10-CM | POA: Diagnosis not present

## 2016-03-15 DIAGNOSIS — E669 Obesity, unspecified: Secondary | ICD-10-CM | POA: Diagnosis not present

## 2016-03-15 DIAGNOSIS — G894 Chronic pain syndrome: Secondary | ICD-10-CM | POA: Diagnosis not present

## 2016-03-15 DIAGNOSIS — M25561 Pain in right knee: Secondary | ICD-10-CM | POA: Diagnosis not present

## 2016-03-15 DIAGNOSIS — M7989 Other specified soft tissue disorders: Secondary | ICD-10-CM | POA: Diagnosis not present

## 2016-03-16 DIAGNOSIS — N528 Other male erectile dysfunction: Secondary | ICD-10-CM | POA: Insufficient documentation

## 2016-03-16 MED FILL — ALPRAZolam 0.5 MG TABS: 0.5 | 30 days supply | Qty: 120 | Fill #3

## 2016-04-09 MED FILL — CIALIS 5 MG TABLET: 5 | 30 days supply | Qty: 30 | Fill #4

## 2016-04-09 MED FILL — BUPRENORPHINE 10 MCG/HR PAT: 10 | 28 days supply | Qty: 4 | Fill #2

## 2016-04-09 MED FILL — OMEPRAZOLE 20 MG CAPSULE DR: 20 | 30 days supply | Qty: 60 | Fill #4

## 2016-04-09 MED FILL — ATORVASTATIN 10 MG TABLET: 10 | 30 days supply | Qty: 30 | Fill #4

## 2016-04-10 MED FILL — traMADol HCL 50 MG TABS: 50 | 23 days supply | Qty: 90 | Fill #0

## 2016-04-12 ENCOUNTER — Ambulatory Visit: Payer: 59 | Attending: Internal Medicine | Admitting: Pharmacist

## 2016-04-12 DIAGNOSIS — Z79899 Other long term (current) drug therapy: Secondary | ICD-10-CM

## 2016-04-12 MED ORDER — ETANERCEPT 50 MG/ML ~~LOC~~ SOAJ: 50.0000 mg | SUBCUTANEOUS | 3 refills | Status: DC

## 2016-04-12 MED FILL — ENBREL 50 MG/ML SURECLICK S: 50 | 28 days supply | Qty: 4 | Fill #0

## 2016-04-12 NOTE — Progress Notes (Signed)
   S: Patient presents to [facility] for review of their specialty medication therapy.  Patient is currently taking Enbrel for rheumatoid arthritis. Patient is managed by Dr. Amil Amen for this.   Adherence: has not started it yet  Efficacy: has not started it yet. He has failed Humira, Morrie Sheldon, and Actemra.  Dosing:  Rheumatoid arthritis: SubQ: Note: May continue methotrexate, glucocorticoids, salicylates, NSAIDs, or analgesics during etanercept therapy. Once-weekly dosing: 50 mg once weekly; maximum dose (rheumatoid arthritis): 50 mg/week.  Screening: TB test: completed per patient Hepatitis B: completed per patient  O:  See Care Everywhere - last labs 12/16/15 - CBC normal except WBC 4.4. Chem panel normal except glucose 103     Lab Results  Component Value Date   WBC 5.6 11/12/2013   HGB 12.1 (L) 11/12/2013   HCT 38.4 (L) 11/12/2013   MCV 91.6 11/12/2013   PLT 225 11/12/2013      Chemistry      Component Value Date/Time   NA 138 11/12/2013 1838   K 4.3 11/12/2013 1838   CL 97 11/12/2013 1838   CO2 31 11/12/2013 1838   BUN 7 11/12/2013 1838   CREATININE 0.80 11/12/2013 1838      Component Value Date/Time   CALCIUM 9.1 11/12/2013 1838       A/P: 1. Medication review: patient currently on Enbrel for rheumatoid arthritis and has not started the medication yet. Reviewed the medication with the patient, including the following: Enbrel (etanercept) binds tumor necrosis factor (TNF) and blocks its interaction with cell surface receptors. TNF plays an important role in the inflammatory processes of many diseases. Adverse effects include rash, GI upset, increased risk of infection, and injection site reactions. Patients should stop Enbrel if they develop a serious infection. There is a possible increased risk in lymphoma and other malignancies. No recommendations for any changes.    Christella Hartigan, PharmD, BCPS, BCACP, [facility] [phone]

## 2016-04-18 MED FILL — ALPRAZolam 0.5 MG TABS: 0.5 | 30 days supply | Qty: 120 | Fill #4

## 2016-05-04 MED FILL — ATORVASTATIN 10 MG TABLET: 10 | 30 days supply | Qty: 30 | Fill #5

## 2016-05-04 MED FILL — OMEPRAZOLE 20 MG CAPSULE DR: 20 | 30 days supply | Qty: 60 | Fill #5

## 2016-05-04 MED FILL — CIALIS 5 MG TABLET: 5 | 30 days supply | Qty: 30 | Fill #5

## 2016-05-04 MED FILL — traMADol HCL 50 MG TABS: 50 | 23 days supply | Qty: 90 | Fill #1

## 2016-05-04 MED FILL — ENBREL 50 MG/ML SURECLICK S: 50 | 28 days supply | Qty: 4 | Fill #1

## 2016-05-08 MED FILL — BUPRENORPHINE 10 MCG/HR PAT: 10 | 28 days supply | Qty: 4 | Fill #0

## 2016-05-15 DIAGNOSIS — Z6830 Body mass index (BMI) 30.0-30.9, adult: Secondary | ICD-10-CM | POA: Diagnosis not present

## 2016-05-15 DIAGNOSIS — M7062 Trochanteric bursitis, left hip: Secondary | ICD-10-CM | POA: Diagnosis not present

## 2016-05-15 DIAGNOSIS — M150 Primary generalized (osteo)arthritis: Secondary | ICD-10-CM | POA: Diagnosis not present

## 2016-05-15 DIAGNOSIS — E669 Obesity, unspecified: Secondary | ICD-10-CM | POA: Diagnosis not present

## 2016-05-15 DIAGNOSIS — Z79899 Other long term (current) drug therapy: Secondary | ICD-10-CM | POA: Diagnosis not present

## 2016-05-15 DIAGNOSIS — M0579 Rheumatoid arthritis with rheumatoid factor of multiple sites without organ or systems involvement: Secondary | ICD-10-CM | POA: Diagnosis not present

## 2016-05-15 DIAGNOSIS — M25561 Pain in right knee: Secondary | ICD-10-CM | POA: Diagnosis not present

## 2016-05-15 DIAGNOSIS — G894 Chronic pain syndrome: Secondary | ICD-10-CM | POA: Diagnosis not present

## 2016-05-21 MED FILL — ALPRAZolam 0.5 MG TABS: 0.5 | 30 days supply | Qty: 120 | Fill #5

## 2016-06-01 MED FILL — ENBREL 50 MG/ML SURECLICK S: 50 | 28 days supply | Qty: 4 | Fill #2

## 2016-06-01 MED FILL — ATORVASTATIN 10 MG TABLET: 10 | 30 days supply | Qty: 30 | Fill #0

## 2016-06-01 MED FILL — CIALIS 5 MG TABLET: 5 | 30 days supply | Qty: 30 | Fill #0

## 2016-06-01 MED FILL — OMEPRAZOLE 20 MG CAPSULE DR: 20 | 30 days supply | Qty: 60 | Fill #6

## 2016-06-04 MED FILL — traMADol HCL 50 MG TABS: 50 | 23 days supply | Qty: 90 | Fill #0

## 2016-06-04 MED FILL — BUPRENORPHINE 10 MCG/HR PAT: 10 | 28 days supply | Qty: 4 | Fill #0

## 2016-06-22 MED FILL — ALPRAZolam 0.5 MG TABS: 0.5 | 30 days supply | Qty: 120 | Fill #0

## 2016-06-28 MED FILL — BUPRENORPHINE 10 MCG/HR PAT: 10 | 28 days supply | Qty: 4 | Fill #0

## 2016-06-29 MED FILL — ENBREL 50 MG/ML SURECLICK S: 50 | 28 days supply | Qty: 4 | Fill #3

## 2016-07-10 DIAGNOSIS — E785 Hyperlipidemia, unspecified: Secondary | ICD-10-CM | POA: Diagnosis not present

## 2016-07-13 DIAGNOSIS — Z0000 Encounter for general adult medical examination without abnormal findings: Secondary | ICD-10-CM | POA: Diagnosis not present

## 2016-07-13 MED FILL — FAMOTIDINE 20 MG TABLET: 20 | 30 days supply | Qty: 60 | Fill #0

## 2016-07-17 ENCOUNTER — Other Ambulatory Visit: Payer: Self-pay | Admitting: Internal Medicine

## 2016-07-17 MED FILL — ATORVASTATIN 10 MG TABLET: 10 | 30 days supply | Qty: 30 | Fill #0

## 2016-07-17 MED FILL — CIALIS 5 MG TABLET: 5 | 30 days supply | Qty: 30 | Fill #0

## 2016-07-27 ENCOUNTER — Other Ambulatory Visit: Payer: Self-pay | Admitting: Pharmacist

## 2016-07-27 ENCOUNTER — Other Ambulatory Visit: Payer: Self-pay | Admitting: Internal Medicine

## 2016-07-27 MED ORDER — ETANERCEPT 50 MG/ML ~~LOC~~ SOAJ: 50.0000 mg | SUBCUTANEOUS | 2 refills | Status: DC

## 2016-07-27 MED FILL — ENBREL 50 MG/ML SURECLICK S: 50 | 28 days supply | Qty: 4 | Fill #0

## 2016-07-31 MED FILL — BUPRENORPHINE 10 MCG/HR PAT: 10 | 28 days supply | Qty: 4 | Fill #0

## 2016-08-14 DIAGNOSIS — M0579 Rheumatoid arthritis with rheumatoid factor of multiple sites without organ or systems involvement: Secondary | ICD-10-CM | POA: Diagnosis not present

## 2016-08-14 DIAGNOSIS — M150 Primary generalized (osteo)arthritis: Secondary | ICD-10-CM | POA: Diagnosis not present

## 2016-08-14 DIAGNOSIS — E669 Obesity, unspecified: Secondary | ICD-10-CM | POA: Diagnosis not present

## 2016-08-14 DIAGNOSIS — M7062 Trochanteric bursitis, left hip: Secondary | ICD-10-CM | POA: Diagnosis not present

## 2016-08-14 DIAGNOSIS — Z79899 Other long term (current) drug therapy: Secondary | ICD-10-CM | POA: Diagnosis not present

## 2016-08-14 DIAGNOSIS — Z6830 Body mass index (BMI) 30.0-30.9, adult: Secondary | ICD-10-CM | POA: Diagnosis not present

## 2016-08-14 DIAGNOSIS — G894 Chronic pain syndrome: Secondary | ICD-10-CM | POA: Diagnosis not present

## 2016-08-14 DIAGNOSIS — M25511 Pain in right shoulder: Secondary | ICD-10-CM | POA: Diagnosis not present

## 2016-08-14 DIAGNOSIS — M25561 Pain in right knee: Secondary | ICD-10-CM | POA: Diagnosis not present

## 2016-08-22 MED FILL — ATORVASTATIN 10 MG TABLET: 10 | 30 days supply | Qty: 30 | Fill #0

## 2016-08-22 MED FILL — OMEPRAZOLE 20 MG CAP: 20 | 30 days supply | Qty: 60 | Fill #7

## 2016-08-22 MED FILL — CIALIS 5 MG TABLET: 5 | 30 days supply | Qty: 30 | Fill #0

## 2016-08-22 MED FILL — ALPRAZolam 0.5 MG TABS: 0.5 | 30 days supply | Qty: 120 | Fill #0

## 2016-08-26 MED FILL — BUPRENORPHINE 10 MCG/HR PAT: 10 | 28 days supply | Qty: 4 | Fill #0

## 2016-08-28 MED FILL — ENBREL 50 MG/ML SURECLICK S: 50 | 28 days supply | Qty: 4 | Fill #1

## 2016-08-30 MED FILL — FOLIC ACID 1 MG TABLET: 1 | 30 days supply | Qty: 30 | Fill #0

## 2016-08-30 MED FILL — METHOTREXATE 25 MG/ML VIAL: 50 | 23 days supply | Qty: 2 | Fill #0

## 2016-09-19 MED FILL — OMEPRAZOLE 20 MG CAP: 20 | 30 days supply | Qty: 60 | Fill #8

## 2016-09-19 MED FILL — ATORVASTATIN 10 MG TABLET: 10 | 30 days supply | Qty: 30 | Fill #1

## 2016-09-19 MED FILL — ALPRAZolam 0.5 MG TABS: 0.5 | 30 days supply | Qty: 120 | Fill #1

## 2016-09-19 MED FILL — CIALIS 5 MG TABLET: 5 | 30 days supply | Qty: 30 | Fill #1

## 2016-09-19 MED FILL — METHOTREXATE 25 MG/ML VIAL: 50 | 23 days supply | Qty: 2 | Fill #1

## 2016-09-19 MED FILL — BUPRENORPHINE 10 MCG/HR PAT: 10 | 30 days supply | Qty: 4 | Fill #0

## 2016-10-01 ENCOUNTER — Telehealth: Payer: Self-pay | Admitting: General Practice

## 2016-10-01 MED FILL — FOLIC ACID 1 MG TABLET: 1 | 30 days supply | Qty: 30 | Fill #1

## 2016-10-01 NOTE — Telephone Encounter (Signed)
Cover My Meds called requesting to know the status of the prior authorization for  etanercept (ENBREL SURECLICK) 50 MG/ML injection   Rep can be reached at [phone] and the pt. reference number is [id number]    Please f/u

## 2016-10-01 NOTE — Telephone Encounter (Signed)
This is for an employee in the [facility] and the prior authorization does not apply to [place].

## 2016-10-02 ENCOUNTER — Other Ambulatory Visit: Payer: Self-pay | Admitting: Pharmacist

## 2016-10-02 MED ORDER — ETANERCEPT 50 MG/ML ~~LOC~~ SOAJ: 50.0000 mg | SUBCUTANEOUS | 3 refills | Status: DC

## 2016-10-02 MED FILL — ENBREL 50 MG/ML SURECLICK S: 50 | 28 days supply | Qty: 4 | Fill #2

## 2016-10-10 MED FILL — METHOTREXATE 25 MG/ML VIAL: 50 | 23 days supply | Qty: 2 | Fill #2

## 2016-10-23 MED FILL — OMEPRAZOLE 20 MG CAP: 20 | 30 days supply | Qty: 60 | Fill #9

## 2016-10-23 MED FILL — CIALIS 5 MG TABLET: 5 | 30 days supply | Qty: 30 | Fill #2

## 2016-10-23 MED FILL — ATORVASTATIN 10 MG TABLET: 10 | 30 days supply | Qty: 30 | Fill #2

## 2016-10-23 MED FILL — ALPRAZolam 0.5 MG TABS: 0.5 | 30 days supply | Qty: 120 | Fill #2

## 2016-10-30 MED FILL — ENBREL 50 MG/ML SURECLICK S: 50 | 28 days supply | Qty: 4 | Fill #0

## 2016-10-30 MED FILL — BUPRENORPHINE 10 MCG/HR PAT: 10 | 28 days supply | Qty: 4 | Fill #0

## 2016-10-30 MED FILL — METHOTREXATE 25 MG/ML VIAL: 50 | 90 days supply | Qty: 8 | Fill #0

## 2016-11-14 DIAGNOSIS — Z6829 Body mass index (BMI) 29.0-29.9, adult: Secondary | ICD-10-CM | POA: Diagnosis not present

## 2016-11-14 DIAGNOSIS — E663 Overweight: Secondary | ICD-10-CM | POA: Diagnosis not present

## 2016-11-14 DIAGNOSIS — G894 Chronic pain syndrome: Secondary | ICD-10-CM | POA: Diagnosis not present

## 2016-11-14 DIAGNOSIS — M150 Primary generalized (osteo)arthritis: Secondary | ICD-10-CM | POA: Diagnosis not present

## 2016-11-14 DIAGNOSIS — Z79899 Other long term (current) drug therapy: Secondary | ICD-10-CM | POA: Diagnosis not present

## 2016-11-14 DIAGNOSIS — M0579 Rheumatoid arthritis with rheumatoid factor of multiple sites without organ or systems involvement: Secondary | ICD-10-CM | POA: Diagnosis not present

## 2016-11-14 DIAGNOSIS — M25511 Pain in right shoulder: Secondary | ICD-10-CM | POA: Diagnosis not present

## 2016-11-14 DIAGNOSIS — Z23 Encounter for immunization: Secondary | ICD-10-CM | POA: Diagnosis not present

## 2016-11-14 MED FILL — FOLIC ACID 1 MG TABLET: 1 | 30 days supply | Qty: 60 | Fill #0

## 2016-11-16 MED FILL — TADALAFIL 5 MG TABS: 5 | 30 days supply | Qty: 30 | Fill #3

## 2016-11-16 MED FILL — OMEPRAZOLE 20 MG CAP: 20 | 30 days supply | Qty: 60 | Fill #10

## 2016-11-16 MED FILL — BUPRENORPHINE 15 MCG/HR PTC: 15 | 28 days supply | Qty: 4 | Fill #0

## 2016-11-16 MED FILL — ATORVASTATIN 10 MG TABLET: 10 | 30 days supply | Qty: 30 | Fill #3

## 2016-11-20 MED FILL — ALPRAZolam 0.5 MG TABS: 0.5 | 30 days supply | Qty: 120 | Fill #3

## 2016-11-21 MED FILL — ENBREL 50 MG/ML SURECLICK S: 50 | 28 days supply | Qty: 4 | Fill #1

## 2016-12-17 MED FILL — ATORVASTATIN 10 MG TABLET: 10 | 30 days supply | Qty: 30 | Fill #4

## 2016-12-17 MED FILL — FOLIC ACID 1 MG TABLET: 1 | 30 days supply | Qty: 60 | Fill #1

## 2016-12-17 MED FILL — BUPRENORPHINE 15 MCG/HR PTC: 15 | 28 days supply | Qty: 4 | Fill #0

## 2016-12-17 MED FILL — ENBREL 50 MG/ML SURECLICK S: 50 | 28 days supply | Qty: 4 | Fill #2

## 2016-12-17 MED FILL — OMEPRAZOLE 20 MG CAP: 20 | 30 days supply | Qty: 60 | Fill #11

## 2016-12-18 MED FILL — ALPRAZolam 0.5 MG TABS: 0.5 | 30 days supply | Qty: 120 | Fill #4

## 2017-01-10 MED FILL — METHOTREXATE 25 MG/ML VIAL: 50 | 84 days supply | Qty: 10 | Fill #0

## 2017-01-12 MED FILL — BUPRENORPHINE 15 MCG/HR PTC: 15 | 28 days supply | Qty: 4 | Fill #0

## 2017-01-14 MED FILL — ATORVASTATIN 10 MG TABLET: 10 | 30 days supply | Qty: 30 | Fill #5

## 2017-01-14 MED FILL — ENBREL 50 MG/ML SURECLICK S: 50 | 28 days supply | Qty: 4 | Fill #3

## 2017-01-14 MED FILL — FOLIC ACID 1 MG TABLET: 1 | 30 days supply | Qty: 60 | Fill #2

## 2017-01-18 DIAGNOSIS — N528 Other male erectile dysfunction: Secondary | ICD-10-CM | POA: Diagnosis not present

## 2017-01-18 DIAGNOSIS — E782 Mixed hyperlipidemia: Secondary | ICD-10-CM | POA: Diagnosis not present

## 2017-01-18 DIAGNOSIS — F411 Generalized anxiety disorder: Secondary | ICD-10-CM | POA: Diagnosis not present

## 2017-01-28 MED FILL — ALPRAZolam 0.5 MG TABS: 0.5 | 30 days supply | Qty: 120 | Fill #0

## 2017-01-28 MED FILL — OMEPRAZOLE 20 MG CAP: 20 | 30 days supply | Qty: 60 | Fill #0

## 2017-01-28 MED FILL — TADALAFIL 5 MG TABS: 5 | 30 days supply | Qty: 30 | Fill #0

## 2017-02-05 ENCOUNTER — Other Ambulatory Visit: Payer: Self-pay | Admitting: Pharmacist

## 2017-02-05 MED ORDER — ETANERCEPT 50 MG/ML ~~LOC~~ SOAJ: 50.0000 mg | SUBCUTANEOUS | 2 refills | Status: DC

## 2017-02-05 MED FILL — ENBREL 50 MG/ML SURECLICK S: 50 | 28 days supply | Qty: 4 | Fill #0

## 2017-02-05 MED FILL — BUPRENORPHINE 15 MCG/HR PTW: 15 | 28 days supply | Qty: 4 | Fill #0

## 2017-02-14 DIAGNOSIS — M150 Primary generalized (osteo)arthritis: Secondary | ICD-10-CM | POA: Diagnosis not present

## 2017-02-14 DIAGNOSIS — Z6830 Body mass index (BMI) 30.0-30.9, adult: Secondary | ICD-10-CM | POA: Diagnosis not present

## 2017-02-14 DIAGNOSIS — M0579 Rheumatoid arthritis with rheumatoid factor of multiple sites without organ or systems involvement: Secondary | ICD-10-CM | POA: Diagnosis not present

## 2017-02-14 DIAGNOSIS — M25511 Pain in right shoulder: Secondary | ICD-10-CM | POA: Diagnosis not present

## 2017-02-14 DIAGNOSIS — G894 Chronic pain syndrome: Secondary | ICD-10-CM | POA: Diagnosis not present

## 2017-02-14 DIAGNOSIS — Z79899 Other long term (current) drug therapy: Secondary | ICD-10-CM | POA: Diagnosis not present

## 2017-02-14 DIAGNOSIS — M791 Myalgia, unspecified site: Secondary | ICD-10-CM | POA: Diagnosis not present

## 2017-02-14 DIAGNOSIS — E669 Obesity, unspecified: Secondary | ICD-10-CM | POA: Diagnosis not present

## 2017-02-20 DIAGNOSIS — Z0000 Encounter for general adult medical examination without abnormal findings: Secondary | ICD-10-CM | POA: Diagnosis not present

## 2017-02-28 MED FILL — TADALAFIL 5 MG TABS: 5 | 30 days supply | Qty: 30 | Fill #1

## 2017-02-28 MED FILL — ENBREL 50 MG/ML SURECLICK S: 50 | 28 days supply | Qty: 4 | Fill #1

## 2017-02-28 MED FILL — ATORVASTATIN 10 MG TABLET: 10 | 30 days supply | Qty: 30 | Fill #0

## 2017-02-28 MED FILL — OMEPRAZOLE 20 MG CAP: 20 | 30 days supply | Qty: 60 | Fill #1

## 2017-02-28 MED FILL — ALPRAZolam 0.5 MG TABS: 0.5 | 30 days supply | Qty: 120 | Fill #1

## 2017-02-28 MED FILL — BUPRENORPHINE 15 MCG/HR PTW: 15 | 28 days supply | Qty: 4 | Fill #0

## 2017-03-22 ENCOUNTER — Encounter: Payer: Self-pay | Admitting: Family Medicine

## 2017-03-22 ENCOUNTER — Ambulatory Visit (INDEPENDENT_AMBULATORY_CARE_PROVIDER_SITE_OTHER): Payer: No Typology Code available for payment source | Admitting: Family Medicine

## 2017-03-22 VITALS — BP 124/74 | HR 77 | Temp 97.6°F | Resp 16 | Ht 68.0 in | Wt 188.0 lb

## 2017-03-22 DIAGNOSIS — J069 Acute upper respiratory infection, unspecified: Secondary | ICD-10-CM | POA: Diagnosis not present

## 2017-03-22 DIAGNOSIS — N400 Enlarged prostate without lower urinary tract symptoms: Secondary | ICD-10-CM

## 2017-03-22 DIAGNOSIS — R062 Wheezing: Secondary | ICD-10-CM | POA: Diagnosis not present

## 2017-03-22 DIAGNOSIS — F411 Generalized anxiety disorder: Secondary | ICD-10-CM

## 2017-03-22 DIAGNOSIS — E785 Hyperlipidemia, unspecified: Secondary | ICD-10-CM | POA: Diagnosis not present

## 2017-03-22 DIAGNOSIS — M069 Rheumatoid arthritis, unspecified: Secondary | ICD-10-CM

## 2017-03-22 MED ORDER — SERTRALINE HCL 25 MG PO TABS: 25.0000 mg | ORAL_TABLET | Freq: Every day | ORAL | 1 refills | Status: DC

## 2017-03-22 MED ORDER — BENZONATATE 100 MG PO CAPS: 100.0000 mg | ORAL_CAPSULE | Freq: Three times a day (TID) | ORAL | 0 refills | Status: DC | PRN

## 2017-03-22 MED ORDER — ALBUTEROL SULFATE HFA 108 (90 BASE) MCG/ACT IN AERS: 2.0000 | INHALATION_SPRAY | Freq: Four times a day (QID) | RESPIRATORY_TRACT | 0 refills | Status: AC | PRN

## 2017-03-22 MED FILL — BENZONATATE 100 MG CAP: 100 | 3 days supply | Qty: 20 | Fill #0

## 2017-03-22 MED FILL — VENTOLIN HFA 90 MCG INHALER: 108 (90 BAS | 25 days supply | Qty: 18 | Fill #0

## 2017-03-22 MED FILL — SERTRALINE HCL 25 MG TABS: 25 | 30 days supply | Qty: 30 | Fill #0

## 2017-03-22 NOTE — Progress Notes (Signed)
3/1/201911:00 AM  Grant Hunter [birth year], 62 y.o. male [id number]  Chief Complaint  Patient presents with  . Cough    x 4 days/ pt states he is having wheezing    HPI:   Patient is a 62 y.o. male with past medical history significant for RA, anxiety, hyperlipidemia who presents today to establish care and c/o cough  1. Cough x 4 days, non productive, wheezing, nasal congestion, pnd. No sob, fever, chills, ear pain, sore throat. Does not smoke, no h/o asthma. Reports bronchitis in the past.   2. Needs to establish care, his previous PCP does not accept his current insurance  3. RA, Dr Lincoln Maxin, [facility], on mtx, embrel and bup patch. Recently on disability.   4. BPH used to see Dr Nevada Crane in [facility], currently well controlled on cialis, finesteride lost its therapeutic effect. Has not tried terasozin and such  5. Hyperlipidemia, started after he was on a different DMARD, daily use of atorvastatin caused myopathy, now taking qod, better. Has never tired pravastatin.   6. Anxiety, mostly related to getting frustrated as unable to do things he used to do before, irritable, has never been on anything other than xanax, also finds it helps relax his hands as they tend to stiffen often thru the day  Depression screen PHQ 2/9 03/22/2017  Decreased Interest 0  Down, Depressed, Hopeless 0  PHQ - 2 Score 0    No Known Allergies  Prior to Admission medications   Medication Sig Start Date End Date Taking? Authorizing Provider  albuterol (PROVENTIL HFA;VENTOLIN HFA) 108 (90 BASE) MCG/ACT inhaler Inhale 1-2 puffs into the lungs every 6 (six) hours as needed for wheezing or shortness of breath. 11/12/13  Yes Elnora Morrison, MD  ALPRAZolam [name]) 0.5 MG tablet Take 0.5 mg by mouth 4 (four) times daily as needed for anxiety (anxiety).    Yes [provider]  Buprenorphine (BUTRANS) 7.5 MCG/HR PTWK Place 7.5 mcg onto the skin once a week. Sunday   Yes [provider]    CALCIUM-MAGNESIUM-ZINC PO Take 1 tablet by mouth daily.   Yes [provider]  Cholecalciferol (VITAMIN D3) 2000 UNITS capsule Take 2,000 Units by mouth daily.   Yes [provider]  etanercept (ENBREL SURECLICK) 50 MG/ML injection Inject 0.98 mLs (50 mg total) into the skin once a week. 02/05/17  Yes Jegede, Olugbemiga E, MD  KRILL OIL PO Take 1 tablet by mouth daily. Reported on 02/01/2015   Yes [provider]  omeprazole (PRILOSEC) 20 MG capsule Take 20 mg by mouth 2 (two) times daily before a meal.   Yes [provider]  tadalafil (CIALIS) 5 MG tablet Take 5 mg by mouth daily as needed for erectile dysfunction.   Yes [provider]  VITAMIN E PO Take 1 tablet by mouth daily.   Yes [provider]  methotraxate injection, weekle flolic acid 1mg  daily Atorvastatin 10mg  qod Loratadine 10mg  daily Melatonin 3mg  qhs   Past Medical History:  Diagnosis Date  . Allergy   . Anxiety   . Colon polyps   . Enlarged prostate   . GERD (gastroesophageal reflux disease)   . Hyperlipidemia   . Legally blind in left eye, as defined in [place]    at age 20, from infection, has left eye prosthesis  . Pneumonia   . Rheumatoid arthritis ([place])    Dr Lincoln Maxin, [facility]    Past Surgical History:  Procedure Laterality Date  . COLONOSCOPY    .  dental implants    . EYE SURGERY     parasitic infection left eye  . FRACTURE SURGERY    . SHOULDER ARTHROSCOPY WITH SUBACROMIAL DECOMPRESSION AND OPEN ROTATOR C Left 11/09/2013   Procedure: LEFT SHOULDER ARTHROSCOPY WITH SUBACROMIAL DECOMPRESSION AND OPEN BICEP TENODESIS, OPEN DISTAL CLAVICLE RESECTION ,open rotator cuff repair ,Debridement slap tear ;  Surgeon: Augustin Schooling, MD;  Location: [facility];  Service: Orthopedics;  Laterality: Left;  . TONSILLECTOMY      Social History   Tobacco Use  . Smoking status: Former Smoker    Last attempt to quit: 11/06/1998    Years since quitting: 18.3  .  Smokeless tobacco: Never Used  Substance Use Topics  . Alcohol use: Yes    Comment: social drinker    Family History  Problem Relation Age of Onset  . Breast cancer Mother     Review of Systems  Constitutional: Negative for chills and fever.  HENT: Positive for congestion. Negative for ear pain and sore throat.   Respiratory: Positive for cough and wheezing. Negative for sputum production and shortness of breath.   Cardiovascular: Negative for chest pain, palpitations and leg swelling.  Gastrointestinal: Negative for abdominal pain, nausea and vomiting.     OBJECTIVE:  Blood pressure 124/74, pulse 77, temperature 97.6 F (36.4 C), temperature source Oral, resp. rate 16, height 5\' 8"  (1.727 m), weight 188 lb (85.3 kg), SpO2 95 %.  Physical Exam  Constitutional: He is oriented to person, place, and time and well-developed, well-nourished, and in no distress.  HENT:  Head: Normocephalic and atraumatic.  Right Ear: Hearing, tympanic membrane, external ear and ear canal normal.  Left Ear: Hearing, tympanic membrane, external ear and ear canal normal.  Mouth/Throat: Oropharynx is clear and moist. No oropharyngeal exudate.  Eyes: Conjunctivae and EOM are normal. Pupils are equal, round, and reactive to light.  Neck: Neck supple.  Cardiovascular: Normal rate and regular rhythm. Exam reveals no gallop and no friction rub.  No murmur heard. Pulmonary/Chest: Effort normal and breath sounds normal. He has no wheezes. He has no rales.  Lymphadenopathy:    He has no cervical adenopathy.  Neurological: He is alert and oriented to person, place, and time. Gait normal.  Skin: Skin is warm and dry.    ASSESSMENT and PLAN  1. URI with cough and congestion Discussed supportive measures for URI: increase hydration, rest, OTC medications, etc. RTC precautions discussed.  2. Wheezing  3. Generalized anxiety disorder Discussed treatment options, new med r/se/b discussed. RTC  precautions  4. Rheumatoid arthritis, involving unspecified site, unspecified rheumatoid factor presence ([place])  5. Hyperlipidemia, unspecified hyperlipidemia type  6. Benign prostatic hyperplasia, unspecified whether lower urinary tract symptoms present  Other orders - albuterol (PROVENTIL HFA;VENTOLIN HFA) 108 (90 Base) MCG/ACT inhaler; Inhale 2 puffs into the lungs every 6 (six) hours as needed for wheezing or shortness of breath. - benzonatate (TESSALON) 100 MG capsule; Take 1-2 capsules (100-200 mg total) by mouth 3 (three) times daily as needed for cough. - sertraline (ZOLOFT) 25 MG tablet; Take 1 tablet (25 mg total) by mouth daily.  Return in about 2 weeks (around 04/05/2017).    Rutherford Guys, MD Primary Care at [place], [place] [address] Ph.  [phone] Fax [phone]

## 2017-03-22 NOTE — Patient Instructions (Signed)
     IF you received an x-ray today, you will receive an invoice from [facility]. Please contact [facility] at [phone] with questions or concerns regarding your invoice.   IF you received labwork today, you will receive an invoice from [facility]. Please contact [facility] at [phone] with questions or concerns regarding your invoice.   Our billing staff will not be able to assist you with questions regarding bills from these companies.  You will be contacted with the lab results as soon as they are available. The fastest way to get your results is to activate your My Chart account. Instructions are located on the last page of this paperwork. If you have not heard from us regarding the results in 2 weeks, please contact this office.     

## 2017-04-01 ENCOUNTER — Ambulatory Visit: Payer: Self-pay | Admitting: Family Medicine

## 2017-04-04 ENCOUNTER — Encounter: Payer: Self-pay | Admitting: Family Medicine

## 2017-04-04 ENCOUNTER — Other Ambulatory Visit: Payer: Self-pay

## 2017-04-04 ENCOUNTER — Ambulatory Visit (INDEPENDENT_AMBULATORY_CARE_PROVIDER_SITE_OTHER): Payer: No Typology Code available for payment source | Admitting: Family Medicine

## 2017-04-04 VITALS — BP 122/86 | HR 73 | Temp 98.0°F | Ht 68.0 in | Wt 186.2 lb

## 2017-04-04 DIAGNOSIS — M069 Rheumatoid arthritis, unspecified: Secondary | ICD-10-CM | POA: Diagnosis not present

## 2017-04-04 DIAGNOSIS — N400 Enlarged prostate without lower urinary tract symptoms: Secondary | ICD-10-CM | POA: Diagnosis not present

## 2017-04-04 DIAGNOSIS — F411 Generalized anxiety disorder: Secondary | ICD-10-CM

## 2017-04-04 DIAGNOSIS — E785 Hyperlipidemia, unspecified: Secondary | ICD-10-CM

## 2017-04-04 MED ORDER — SERTRALINE HCL 50 MG PO TABS: 50.0000 mg | ORAL_TABLET | Freq: Every day | ORAL | 3 refills | Status: DC

## 2017-04-04 MED ORDER — TADALAFIL 5 MG PO TABS: 5.0000 mg | ORAL_TABLET | Freq: Every day | ORAL | 3 refills | Status: DC

## 2017-04-04 MED FILL — METHOTREXATE 25 MG/ML VIAL: 50 | 84 days supply | Qty: 10 | Fill #1

## 2017-04-04 MED FILL — OMEPRAZOLE 20 MG CAP: 20 | 30 days supply | Qty: 60 | Fill #2

## 2017-04-04 MED FILL — ATORVASTATIN 10 MG TABLET: 10 | 30 days supply | Qty: 30 | Fill #1

## 2017-04-04 MED FILL — ENBREL 50 MG/ML SURECLICK S: 50 | 28 days supply | Qty: 4 | Fill #2

## 2017-04-04 MED FILL — ALPRAZolam 0.5 MG TABS: 0.5 | 30 days supply | Qty: 120 | Fill #2

## 2017-04-04 NOTE — Progress Notes (Signed)
3/14/201912:05 PM  Grant Hunter Oct 26, 1955, 62 y.o. male [id number]  Chief Complaint  Patient presents with  . Follow-up    follow up for URI cough and congestion. Say that he is feeling much better. Having no symptoms    HPI:   Patient is a 62 y.o. male with past medical history significant for chronic pain, RA, adjustement disorder with anxiety who presents today for followup  He recently had a URI, has recovered well without any issues Also recently started in sertraline, tolerating well, did not titrate up Due for routine labs Reports other chronic medical conditions are well controlled on current meds Has no acute issues today  Depression screen [facility] 2/9 04/04/2017 03/22/2017  Decreased Interest 0 0  Down, Depressed, Hopeless 0 0  PHQ - 2 Score 0 0    No Known Allergies  Prior to Admission medications   Medication Sig Start Date End Date Taking? Authorizing Provider  albuterol (PROVENTIL HFA;VENTOLIN HFA) 108 (90 Base) MCG/ACT inhaler Inhale 2 puffs into the lungs every 6 (six) hours as needed for wheezing or shortness of breath. 03/22/17  Yes Rutherford Guys, MD  ALPRAZolam [name]) 0.5 MG tablet Take 0.5 mg by mouth every 6 (six) hours as needed for anxiety (anxiety).    Yes [provider]  atorvastatin (LIPITOR) 10 MG tablet Take 10 mg by mouth every other day.   Yes [provider]  b complex vitamins capsule Take 1 capsule by mouth daily.   Yes [provider]  benzonatate (TESSALON) 100 MG capsule Take 1-2 capsules (100-200 mg total) by mouth 3 (three) times daily as needed for cough. 03/22/17  Yes Rutherford Guys, MD  buprenorphine Haze Rushing) 10 MCG/HR PTWK patch Place 15 mcg onto the skin once a week. Sunday   Yes [provider]  CALCIUM-MAGNESIUM-ZINC PO Take 1 tablet by mouth daily.   Yes [provider]  Cholecalciferol (VITAMIN D3) 2000 UNITS capsule Take 2,000 Units by mouth daily.   Yes [provider]    docusate sodium (COLACE) 100 MG capsule Take 100 mg by mouth 2 (two) times daily.   Yes [provider]  etanercept (ENBREL SURECLICK) 50 MG/ML injection Inject 0.98 mLs (50 mg total) into the skin once a week. 02/05/17  Yes Tresa Garter, MD  folic acid (FOLVITE) 1 MG tablet Take 1 mg by mouth daily.   Yes [provider]  KRILL OIL PO Take 1 tablet by mouth daily. Reported on 02/01/2015   Yes [provider]  loratadine (CLARITIN) 10 MG tablet Take 10 mg by mouth daily.   Yes [provider]  Melatonin 3 MG TBDP Take 3 mg by mouth at bedtime.   Yes [provider]  METHOTREXATE SODIUM IJ Inject into the muscle once a week.    Yes [provider]  omeprazole (PRILOSEC) 20 MG capsule Take 20 mg by mouth 2 (two) times daily before a meal.   Yes [provider]  sertraline (ZOLOFT) 25 MG tablet Take 1 tablet (25 mg total) by mouth daily. 03/22/17  Yes Rutherford Guys, MD  tadalafil (CIALIS) 5 MG tablet Take 5 mg by mouth daily as needed for erectile dysfunction.   Yes [provider]  Tofacitinib Citrate (XELJANZ) 5 MG TABS Take 5 mg by mouth 2 (two) times daily.   Yes [provider]  VITAMIN E PO Take 1 tablet by mouth daily.   Yes [provider]  finasteride (PROSCAR) 5  MG tablet Take 5 mg by mouth daily. Reported on 02/01/2015    [provider]    Past Medical History:  Diagnosis Date  . Allergy   . Anxiety   . Colon polyps   . Enlarged prostate   . GERD (gastroesophageal reflux disease)   . Hyperlipidemia   . Legally blind in left eye, as defined in [place]    at age 46, from infection, has left eye prosthesis  . Pneumonia   . Rheumatoid arthritis ([place])    Dr Lincoln Maxin, [facility]    Past Surgical History:  Procedure Laterality Date  . COLONOSCOPY    . dental implants    . EYE SURGERY     parasitic infection left eye  . FRACTURE SURGERY    . SHOULDER ARTHROSCOPY WITH  SUBACROMIAL DECOMPRESSION AND OPEN ROTATOR C Left 11/09/2013   Procedure: LEFT SHOULDER ARTHROSCOPY WITH SUBACROMIAL DECOMPRESSION AND OPEN BICEP TENODESIS, OPEN DISTAL CLAVICLE RESECTION ,open rotator cuff repair ,Debridement slap tear ;  Surgeon: Augustin Schooling, MD;  Location: [facility];  Service: Orthopedics;  Laterality: Left;  . TONSILLECTOMY      Social History   Tobacco Use  . Smoking status: Former Smoker    Last attempt to quit: 11/06/1998    Years since quitting: 18.4  . Smokeless tobacco: Never Used  Substance Use Topics  . Alcohol use: Yes    Comment: social drinker    Family History  Problem Relation Age of Onset  . Breast cancer Mother     ROS Per hpi  OBJECTIVE:  Blood pressure 122/86, pulse 73, temperature 98 F (36.7 C), temperature source Oral, height 5\' 8"  (1.727 m), weight 186 lb 3.2 oz (84.5 kg), SpO2 95 %.  Physical Exam  Constitutional: He is oriented to person, place, and time and well-developed, well-nourished, and in no distress.  HENT:  Head: Normocephalic and atraumatic.  Mouth/Throat: Oropharynx is clear and moist.  Eyes: Pupils are equal, round, and reactive to light. EOM are normal.  Neck: Neck supple.  Pulmonary/Chest: Effort normal.  Neurological: He is alert and oriented to person, place, and time. Gait normal.  Skin: Skin is warm and dry.  Psychiatric: Mood and affect normal.  Nursing note and vitals reviewed.    ASSESSMENT and PLAN  1. Generalized anxiety disorder Discussed titrating sertaline. Discussed counseling given that many symptoms arise from recent disability and not working. Patient declines at this time.  - TSH  2. Hyperlipidemia, unspecified hyperlipidemia type - Comprehensive metabolic panel - Lipid panel  3. Benign prostatic hyperplasia, unspecified whether lower urinary tract symptoms present - PSA  4. Rheumatoid arthritis, involving unspecified site, unspecified rheumatoid factor presence ([facility]) - CBC  Other  orders - sertraline (ZOLOFT) 50 MG tablet; Take 1 tablet (50 mg total) by mouth daily.  Return in about 6 weeks (around 05/16/2017).    Rutherford Guys, MD Primary Care at [place], [place] [address] Ph.  [phone] Fax [phone]

## 2017-04-04 NOTE — Patient Instructions (Addendum)
.  if after 3 weeks of new dose no changes in anxiety then increase 100mg    IF you received an x-ray today, you will receive an invoice from [facility]. Please contact [facility] at [phone] with questions or concerns regarding your invoice.   IF you received labwork today, you will receive an invoice from [facility]. Please contact [facility] at [phone] with questions or concerns regarding your invoice.   Our billing staff will not be able to assist you with questions regarding bills from these companies.  You will be contacted with the lab results as soon as they are available. The fastest way to get your results is to activate your My Chart account. Instructions are located on the last page of this paperwork. If you have not heard from [place] regarding the results in 2 weeks, please contact this office.

## 2017-04-05 LAB — LIPID PANEL
Chol/HDL Ratio: 3.6 ratio (ref 0.0–5.0)
Cholesterol, Total: 148 mg/dL (ref 100–199)
HDL: 41 mg/dL (ref >39)
LDL Calculated: 96 mg/dL (ref 0–99)
Triglycerides: 54 mg/dL (ref 0–149)
VLDL Cholesterol Cal: 11 mg/dL (ref 5–40)

## 2017-04-05 LAB — COMPREHENSIVE METABOLIC PANEL
ALT: 20 IU/L (ref 0–44)
AST: 27 IU/L (ref 0–40)
Albumin/Globulin Ratio: 1.7 (ref 1.2–2.2)
Albumin: 4.5 g/dL (ref 3.6–4.8)
Alkaline Phosphatase: 59 IU/L (ref 39–117)
BUN/Creatinine Ratio: 16 (ref 10–24)
BUN: 13 mg/dL (ref 8–27)
Bilirubin Total: 0.4 mg/dL (ref 0.0–1.2)
CO2: 22 mmol/L (ref 20–29)
Calcium: 9.5 mg/dL (ref 8.6–10.2)
Chloride: 98 mmol/L (ref 96–106)
Creatinine, Ser: 0.8 mg/dL (ref 0.76–1.27)
GFR calc Af Amer: 110 mL/min/{1.73_m2} (ref >59)
GFR calc non Af Amer: 95 mL/min/{1.73_m2} (ref >59)
Globulin, Total: 2.6 g/dL (ref 1.5–4.5)
Glucose: 94 mg/dL (ref 65–99)
Potassium: 4.5 mmol/L (ref 3.5–5.2)
Sodium: 137 mmol/L (ref 134–144)
Total Protein: 7.1 g/dL (ref 6.0–8.5)

## 2017-04-05 LAB — CBC
Hematocrit: 40.1 % (ref 37.5–51.0)
Hemoglobin: 13.6 g/dL (ref 13.0–17.7)
MCH: 29.6 pg (ref 26.6–33.0)
MCHC: 33.9 g/dL (ref 31.5–35.7)
MCV: 87 fL (ref 79–97)
Platelets: 283 10*3/uL (ref 150–379)
RBC: 4.6 x10E6/uL (ref 4.14–5.80)
RDW: 14.8 % (ref 12.3–15.4)
WBC: 5 10*3/uL (ref 3.4–10.8)

## 2017-04-05 LAB — PSA: Prostate Specific Ag, Serum: 2.6 ng/mL (ref 0.0–4.0)

## 2017-04-05 LAB — TSH: TSH: 0.854 u[IU]/mL (ref 0.450–4.500)

## 2017-04-10 ENCOUNTER — Telehealth: Payer: Self-pay | Admitting: Family Medicine

## 2017-04-10 NOTE — Telephone Encounter (Signed)
Copied from [place] [id number]. Topic: Quick Communication - See Telephone Encounter >> Apr 10, 2017 11:09 AM Ether Griffins B wrote: CRM for notification. See Telephone encounter for:  Pt is needing a PA on tadalafil (CIALIS) 5 MG tablet. Pt will be out of the medication around the 24th.  04/10/17.

## 2017-04-12 MED FILL — BUPRENORPHINE 15 MCG/HR PTW: 15 | 28 days supply | Qty: 4 | Fill #0

## 2017-04-12 MED FILL — SERTRALINE HCL 50 MG TABLET: 50 | 30 days supply | Qty: 30 | Fill #0

## 2017-04-14 ENCOUNTER — Encounter: Payer: Self-pay | Admitting: Family Medicine

## 2017-04-16 ENCOUNTER — Telehealth: Payer: Self-pay | Admitting: Family Medicine

## 2017-04-16 NOTE — Telephone Encounter (Signed)
PA STARTED CIALIS  #[id number]

## 2017-04-17 ENCOUNTER — Telehealth: Payer: Self-pay

## 2017-04-17 NOTE — Telephone Encounter (Signed)
Copied from [place] [phone]. Topic: Quick Communication - See Telephone Encounter >> Apr 10, 2017 11:09 AM Ether Griffins B wrote: CRM for notification. See Telephone encounter for:  Pt is needing a PA on tadalafil (CIALIS) 5 MG tablet.  04/10/17. >> Apr 16, 2017  1:57 PM Yvette Rack wrote: Pt wife calling stating that pt is out of medicine and want to know when was the PA was started

## 2017-04-18 NOTE — Telephone Encounter (Signed)
Pa was started on 3-26 .   It still has not been approved.

## 2017-04-18 NOTE — Telephone Encounter (Signed)
The PA was started on the 26.   Called to check has not been approved yet.  But it is 24 to 72 hours .   Advised to check 04/19/2017

## 2017-04-19 MED FILL — TADALAFIL 5 MG TABS: 5 | 30 days supply | Qty: 30 | Fill #0

## 2017-04-29 MED FILL — ATORVASTATIN 10 MG TABLET: 10 | 30 days supply | Qty: 30 | Fill #2

## 2017-04-29 MED FILL — SERTRALINE HCL 25 MG TABS: 25 | 30 days supply | Qty: 30 | Fill #1

## 2017-04-29 MED FILL — OMEPRAZOLE 20 MG CAP: 20 | 30 days supply | Qty: 60 | Fill #3

## 2017-04-29 MED FILL — ALPRAZolam 0.5 MG TABS: 0.5 | 30 days supply | Qty: 120 | Fill #3

## 2017-05-03 ENCOUNTER — Other Ambulatory Visit: Payer: Self-pay | Admitting: Pharmacist

## 2017-05-03 MED ORDER — ETANERCEPT 50 MG/ML ~~LOC~~ SOAJ: 50.0000 mg | SUBCUTANEOUS | 2 refills | Status: DC

## 2017-05-15 MED FILL — ENBREL 50 MG/ML SURECLICK S: 50 | 28 days supply | Qty: 4 | Fill #0

## 2017-05-16 ENCOUNTER — Ambulatory Visit: Payer: Medicare Other | Admitting: Family Medicine

## 2017-05-20 DIAGNOSIS — M791 Myalgia, unspecified site: Secondary | ICD-10-CM | POA: Diagnosis not present

## 2017-05-20 DIAGNOSIS — G894 Chronic pain syndrome: Secondary | ICD-10-CM | POA: Diagnosis not present

## 2017-05-20 DIAGNOSIS — E663 Overweight: Secondary | ICD-10-CM | POA: Diagnosis not present

## 2017-05-20 DIAGNOSIS — Z79899 Other long term (current) drug therapy: Secondary | ICD-10-CM | POA: Diagnosis not present

## 2017-05-20 DIAGNOSIS — M25511 Pain in right shoulder: Secondary | ICD-10-CM | POA: Diagnosis not present

## 2017-05-20 DIAGNOSIS — Z6829 Body mass index (BMI) 29.0-29.9, adult: Secondary | ICD-10-CM | POA: Diagnosis not present

## 2017-05-20 DIAGNOSIS — M0579 Rheumatoid arthritis with rheumatoid factor of multiple sites without organ or systems involvement: Secondary | ICD-10-CM | POA: Diagnosis not present

## 2017-05-20 DIAGNOSIS — M150 Primary generalized (osteo)arthritis: Secondary | ICD-10-CM | POA: Diagnosis not present

## 2017-05-20 MED FILL — DICLOFENAC SODIUM 1% GEL: 1 | 30 days supply | Qty: 200 | Fill #0

## 2017-05-20 MED FILL — BUPRENORPHINE 15 MCG/HR PTW: 15 | 28 days supply | Qty: 4 | Fill #0

## 2017-05-29 ENCOUNTER — Other Ambulatory Visit: Payer: Self-pay | Admitting: Family Medicine

## 2017-05-29 MED FILL — ATORVASTATIN 10 MG TABLET: 10 | 30 days supply | Qty: 30 | Fill #3

## 2017-05-29 MED FILL — ALPRAZolam 0.5 MG TABS: 0.5 | 30 days supply | Qty: 120 | Fill #4

## 2017-05-29 MED FILL — OMEPRAZOLE 20 MG CAP: 20 | 30 days supply | Qty: 60 | Fill #4

## 2017-05-29 MED FILL — TADALAFIL 5 MG TABS: 5 | 30 days supply | Qty: 30 | Fill #1

## 2017-05-29 MED FILL — FOLIC ACID 1 MG TABS: 1 | 30 days supply | Qty: 60 | Fill #0

## 2017-05-29 NOTE — Telephone Encounter (Signed)
Zoloft 25 mg refill request.     LOV 04/04/17 with Dr. Pamella Pert.   Dose was increased to 50 mg and was for a f/u in 4 weeks.   No show at 05/16/17 appt.

## 2017-05-31 MED FILL — SERTRALINE HCL 25 MG TABS: 25 | 30 days supply | Qty: 30 | Fill #0

## 2017-05-31 NOTE — Telephone Encounter (Signed)
Med refill req Sertraline sent to Dr. Pamella Pert

## 2017-06-12 MED FILL — DICLOFENAC SODIUM 1% GEL: 1 | 30 days supply | Qty: 200 | Fill #1

## 2017-06-12 MED FILL — ENBREL 50 MG/ML SURECLICK S: 50 | 28 days supply | Qty: 4 | Fill #1

## 2017-06-14 MED FILL — BUPRENORPHINE 15 MCG/HR PTC: 15 | 28 days supply | Qty: 4 | Fill #0

## 2017-06-27 MED FILL — ATORVASTATIN 10 MG TABLET: 10 | 30 days supply | Qty: 30 | Fill #4

## 2017-06-27 MED FILL — OMEPRAZOLE 20 MG CAP: 20 | 30 days supply | Qty: 60 | Fill #5

## 2017-06-27 MED FILL — ALPRAZolam 0.5 MG TABS: 0.5 | 30 days supply | Qty: 120 | Fill #5

## 2017-06-27 MED FILL — TADALAFIL 5 MG TABS: 5 | 30 days supply | Qty: 30 | Fill #2

## 2017-06-27 MED FILL — FOLIC ACID 1 MG TABS: 1 | 30 days supply | Qty: 60 | Fill #1

## 2017-06-27 MED FILL — SERTRALINE HCL 25 MG TABS: 25 | 30 days supply | Qty: 30 | Fill #1

## 2017-07-09 MED FILL — ENBREL 50 MG/ML SURECLICK S: 50 | 28 days supply | Qty: 4 | Fill #2

## 2017-07-11 MED FILL — BUPRENORPHINE 15 MCG/HR PTC: 15 | 28 days supply | Qty: 4 | Fill #0

## 2017-07-16 ENCOUNTER — Other Ambulatory Visit: Payer: Self-pay | Admitting: Family Medicine

## 2017-07-16 MED ORDER — SERTRALINE HCL 100 MG PO TABS: 100.0000 mg | ORAL_TABLET | Freq: Every day | ORAL | 5 refills | Status: DC

## 2017-07-16 MED FILL — SERTRALINE HCL 100 MG TAB: 100 | 30 days supply | Qty: 30 | Fill #0

## 2017-07-17 ENCOUNTER — Encounter: Payer: Self-pay | Admitting: Family Medicine

## 2017-07-17 ENCOUNTER — Other Ambulatory Visit: Payer: Self-pay

## 2017-07-17 ENCOUNTER — Ambulatory Visit (INDEPENDENT_AMBULATORY_CARE_PROVIDER_SITE_OTHER): Payer: No Typology Code available for payment source | Admitting: Family Medicine

## 2017-07-17 VITALS — BP 128/76 | HR 78 | Temp 98.0°F | Ht 68.11 in | Wt 190.2 lb

## 2017-07-17 DIAGNOSIS — F411 Generalized anxiety disorder: Secondary | ICD-10-CM

## 2017-07-17 DIAGNOSIS — Z970 Presence of artificial eye: Secondary | ICD-10-CM | POA: Diagnosis not present

## 2017-07-17 DIAGNOSIS — E785 Hyperlipidemia, unspecified: Secondary | ICD-10-CM

## 2017-07-17 DIAGNOSIS — M791 Myalgia, unspecified site: Secondary | ICD-10-CM

## 2017-07-17 MED ORDER — ERYTHROMYCIN 5 MG/GM OP OINT: 1.0000 "application " | TOPICAL_OINTMENT | Freq: Every day | OPHTHALMIC | 1 refills | Status: DC

## 2017-07-17 MED ORDER — ALPRAZOLAM 0.5 MG PO TABS: 0.5000 mg | ORAL_TABLET | Freq: Four times a day (QID) | ORAL | 3 refills | Status: DC | PRN

## 2017-07-17 MED FILL — DICLOFENAC SODIUM 1% GEL: 1 | 30 days supply | Qty: 200 | Fill #2

## 2017-07-17 MED FILL — ERYTHROMYCIN 0.5% EYE OINT: 5 | 10 days supply | Qty: 4 | Fill #0

## 2017-07-17 NOTE — Patient Instructions (Addendum)
  Please come in fasting one week prior to our next appointment to have your lipids drawn.    IF you received an x-ray today, you will receive an invoice from [facility]. Please contact [facility] at [phone] with questions or concerns regarding your invoice.   IF you received labwork today, you will receive an invoice from [facility]. Please contact [facility] at [phone] with questions or concerns regarding your invoice.   Our billing staff will not be able to assist you with questions regarding bills from these companies.  You will be contacted with the lab results as soon as they are available. The fastest way to get your results is to activate your My Chart account. Instructions are located on the last page of this paperwork. If you have not heard from [place] regarding the results in 2 weeks, please contact this office.

## 2017-07-17 NOTE — Progress Notes (Signed)
6/26/20192:29 PM  Grant Hunter [birth year], 62 y.o. male [id number]  Chief Complaint  Patient presents with  . Anxiety    Medication is doing very well    HPI:   Patient is a 62 y.o. male with past medical history significant for GAD, RA, chronic pain, BPH, left eye prothesis who presents today for followup  Started on sertraline, titrated upto 100mg  once a day. Tolerating well. Feels anxiety improved.  GAD 7= score 7, restlessness and trouble relaxing more related to pain and spasms than mood Takes xanax about q 6 hours, he states that most of it if for pain/muscle spasms He is also wondering if atorvastatin is contributing to increase muscle pain Last lipids normal Continues to see rheum bph well controlled on cialis He is also requesting refill of erythromycin, will use it very prn for his prosthesis, given by optho in the past, has had an eye prosthesis since age 64   Fall Risk  07/17/2017 04/04/2017 03/22/2017  Falls in the past year? No No No     Depression screen [facility] 2/9 07/17/2017 04/04/2017 03/22/2017  Decreased Interest 0 0 0  Down, Depressed, Hopeless 0 0 0  PHQ - 2 Score 0 0 0    No Known Allergies  Prior to Admission medications   Medication Sig Start Date End Date Taking? Authorizing Provider  albuterol (PROVENTIL HFA;VENTOLIN HFA) 108 (90 Base) MCG/ACT inhaler Inhale 2 puffs into the lungs every 6 (six) hours as needed for wheezing or shortness of breath. 03/22/17  Yes Rutherford Guys, MD  ALPRAZolam [name]) 0.5 MG tablet Take 0.5 mg by mouth every 6 (six) hours as needed for anxiety (anxiety).    Yes [provider]  atorvastatin (LIPITOR) 10 MG tablet Take 10 mg by mouth every other day.   Yes [provider]  b complex vitamins capsule Take 1 capsule by mouth daily.   Yes [provider]  buprenorphine (BUTRANS) 10 MCG/HR PTWK patch Place 15 mcg onto the skin once a week. Sunday   Yes [provider]  CALCIUM-MAGNESIUM-ZINC PO  Take 1 tablet by mouth daily.   Yes [provider]  Cholecalciferol (VITAMIN D3) 2000 UNITS capsule Take 2,000 Units by mouth daily.   Yes [provider]  docusate sodium (COLACE) 100 MG capsule Take 100 mg by mouth 2 (two) times daily.   Yes [provider]  etanercept (ENBREL SURECLICK) 50 MG/ML injection Inject 0.98 mLs (50 mg total) into the skin once a week. 05/03/17  Yes Tresa Garter, MD  folic acid (FOLVITE) 1 MG tablet Take 1 mg by mouth daily.   Yes [provider]  KRILL OIL PO Take 1 tablet by mouth daily. Reported on 02/01/2015   Yes [provider]  loratadine (CLARITIN) 10 MG tablet Take 10 mg by mouth daily.   Yes [provider]  Melatonin 3 MG TBDP Take 3 mg by mouth at bedtime.   Yes [provider]  METHOTREXATE SODIUM IJ Inject 60 mg into the muscle once a week.    Yes [provider]  omeprazole (PRILOSEC) 20 MG capsule Take 20 mg by mouth 2 (two) times daily before a meal.   Yes [provider]  sertraline (ZOLOFT) 100 MG tablet Take 1 tablet (100 mg total) by mouth daily. 07/16/17  Yes Rutherford Guys, MD  tadalafil (CIALIS) 5 MG tablet Take 1 tablet (5 mg total) by mouth at bedtime. 04/04/17  Yes Grant Fontana M,  MD  Tofacitinib Citrate (XELJANZ) 5 MG TABS Take 5 mg by mouth 2 (two) times daily.   Yes [provider]  VITAMIN E PO Take 1 tablet by mouth daily.   Yes [provider]    Past Medical History:  Diagnosis Date  . Allergy   . Anxiety   . Colon polyps   . Enlarged prostate   . GERD (gastroesophageal reflux disease)   . Hyperlipidemia   . Legally blind in left eye, as defined in [place]    at age 1, from infection, has left eye prosthesis  . Pneumonia   . Rheumatoid arthritis ([place])    Dr Lincoln Maxin, [facility]    Past Surgical History:  Procedure Laterality Date  . COLONOSCOPY    . dental implants    . EYE SURGERY     parasitic infection left  eye  . FRACTURE SURGERY    . SHOULDER ARTHROSCOPY WITH SUBACROMIAL DECOMPRESSION AND OPEN ROTATOR C Left 11/09/2013   Procedure: LEFT SHOULDER ARTHROSCOPY WITH SUBACROMIAL DECOMPRESSION AND OPEN BICEP TENODESIS, OPEN DISTAL CLAVICLE RESECTION ,open rotator cuff repair ,Debridement slap tear ;  Surgeon: Augustin Schooling, MD;  Location: [facility];  Service: Orthopedics;  Laterality: Left;  . TONSILLECTOMY      Social History   Tobacco Use  . Smoking status: Former Smoker    Last attempt to quit: 11/06/1998    Years since quitting: 18.7  . Smokeless tobacco: Never Used  Substance Use Topics  . Alcohol use: Yes    Comment: social drinker    Family History  Problem Relation Age of Onset  . Breast cancer Mother     Review of Systems  Constitutional: Negative for chills and fever.  Respiratory: Negative for cough and shortness of breath.   Cardiovascular: Negative for chest pain, palpitations and leg swelling.  Gastrointestinal: Negative for abdominal pain, nausea and vomiting.   Per hpi  OBJECTIVE:  Blood pressure 128/76, pulse 78, temperature 98 F (36.7 C), temperature source Oral, height 5' 8.11" (1.73 m), weight 190 lb 3.2 oz (86.3 kg), SpO2 94 %.  Physical Exam  Constitutional: He is oriented to person, place, and time. He appears well-developed and well-nourished.  HENT:  Head: Normocephalic and atraumatic.  Mouth/Throat: Oropharynx is clear and moist.  Eyes: Pupils are equal, round, and reactive to light. EOM are normal.  Neck: Neck supple.  Pulmonary/Chest: Effort normal.  Neurological: He is alert and oriented to person, place, and time.  Skin: Skin is warm and dry.  Psychiatric: He has a normal mood and affect.  Nursing note and vitals reviewed.    ASSESSMENT and PLAN  1. Generalized anxiety disorder Controlled. Continue current regime. [place] CSR reviewed. Patient aware of concerns regarding bzd and opiates co-use.  2. Hyperlipidemia, unspecified hyperlipidemia  type 4. Myalgia Will dc atorvastatin and evaluate for changes in myalgia. Recheck lipids prior to our next appointment. Calculate ascvd at that time.  - Lipid panel; Future  3. Prosthetic eye globe Refilled erythromycin.   Other orders - erythromycin ophthalmic ointment; Place 1 application into the left eye at bedtime. - ALPRAZolam (XANAX) 0.5 MG tablet; Take 1 tablet (0.5 mg total) by mouth every 6 (six) hours as needed for anxiety (anxiety).  Return in about 3 months (around 10/17/2017).    Rutherford Guys, MD Primary Care at [facility] [place], [place] [address] Ph.  [phone] Fax [phone]

## 2017-07-24 MED FILL — ERYTHROMYCIN 0.5% EYE OINT: 5 | 10 days supply | Qty: 4 | Fill #1

## 2017-07-24 MED FILL — TADALAFIL 5 MG TABS: 5 | 30 days supply | Qty: 30 | Fill #3

## 2017-07-24 MED FILL — METHOTREXATE 25 MG/ML VIAL: 50 | 90 days supply | Qty: 10 | Fill #0

## 2017-07-24 MED FILL — ATORVASTATIN 10 MG TABLET: 10 | 30 days supply | Qty: 30 | Fill #5

## 2017-07-24 MED FILL — OMEPRAZOLE 20 MG CAP: 20 | 30 days supply | Qty: 60 | Fill #6

## 2017-07-24 MED FILL — ALPRAZolam 0.5 MG TABS: 0.5 | 30 days supply | Qty: 120 | Fill #0

## 2017-08-02 ENCOUNTER — Other Ambulatory Visit: Payer: Self-pay | Admitting: Internal Medicine

## 2017-08-02 ENCOUNTER — Other Ambulatory Visit: Payer: Self-pay | Admitting: Pharmacist

## 2017-08-02 MED ORDER — ETANERCEPT 50 MG/ML ~~LOC~~ SOAJ: 50.0000 mg | SUBCUTANEOUS | 2 refills | Status: DC

## 2017-08-02 MED FILL — ENBREL 50 MG/ML SURECLICK S: 50 | 28 days supply | Qty: 4 | Fill #0

## 2017-08-06 MED FILL — FOLIC ACID 1 MG TABS: 1 | 30 days supply | Qty: 30 | Fill #0

## 2017-08-06 MED FILL — BUPRENORPHINE 15 MCG/HR PTC: 15 | 28 days supply | Qty: 4 | Fill #0

## 2017-08-09 ENCOUNTER — Ambulatory Visit (INDEPENDENT_AMBULATORY_CARE_PROVIDER_SITE_OTHER): Payer: Medicare Other | Admitting: Pharmacist

## 2017-08-09 DIAGNOSIS — Z79899 Other long term (current) drug therapy: Secondary | ICD-10-CM

## 2017-08-09 NOTE — Progress Notes (Signed)
   S: Patient is currently taking Enbrel for rheumatoid arthritis. Patient is managed by Dr. Amil Amen for this.   Adherence: denies any missed doses  Efficacy: reports that it is working well He has failed Humira, Morrie Sheldon, and Actemra.  Dosing:  Rheumatoid arthritis: SubQ: Note: May continue methotrexate, glucocorticoids, salicylates, NSAIDs, or analgesics during etanercept therapy. Once-weekly dosing: 50 mg once weekly; maximum dose (rheumatoid arthritis): 50 mg/week.  Screening: TB test: completed per patient Hepatitis B: completed per patient  Monitoring: S/sx of infection: denies CBC: monitored q3 months by rheumatology Hypersensitivity reactions: denies Injection site reactions: denies Other adverse effects: denies  O:       Lab Results  Component Value Date   WBC 5.0 04/04/2017   HGB 13.6 04/04/2017   HCT 40.1 04/04/2017   MCV 87 04/04/2017   PLT 283 04/04/2017      Chemistry      Component Value Date/Time   NA 137 04/04/2017 1438   K 4.5 04/04/2017 1438   CL 98 04/04/2017 1438   CO2 22 04/04/2017 1438   BUN 13 04/04/2017 1438   CREATININE 0.80 04/04/2017 1438      Component Value Date/Time   CALCIUM 9.5 04/04/2017 1438   ALKPHOS 59 04/04/2017 1438   AST 27 04/04/2017 1438   ALT 20 04/04/2017 1438   BILITOT 0.4 04/04/2017 1438       A/P: 1. Medication review: patient currently on Enbrel for rheumatoid arthritis and it tolerating it well with improved control of RA. Denies any adverse effects. Reviewed the medication with the patient, including the following: Enbrel (etanercept) binds tumor necrosis factor (TNF) and blocks its interaction with cell surface receptors. TNF plays an important role in the inflammatory processes of many diseases. Adverse effects include rash, GI upset, increased risk of infection, and injection site reactions. Patients should stop Enbrel if they develop a serious infection. There is a possible increased risk in lymphoma and  other malignancies. No recommendations for any changes. Patient to continue to follow up regularly with rheumatology.    Christella Hartigan, PharmD, BCPS, BCACP, CPP Clinical Pharmacist Practitioner  [phone]

## 2017-08-20 MED FILL — traMADol HCL 50 MG TABS: 50 | 22 days supply | Qty: 90 | Fill #0

## 2017-08-21 ENCOUNTER — Telehealth: Payer: Self-pay

## 2017-08-21 DIAGNOSIS — M069 Rheumatoid arthritis, unspecified: Secondary | ICD-10-CM

## 2017-08-21 NOTE — Telephone Encounter (Signed)
Copied from [place] [id number]. Topic: Referral - Request >> Aug 21, 2017 10:02 AM Synthia Innocent wrote: Reason for CRM: Insurance is requiring referral for appointment with rheumatologist Dr Amil Amen for arthritis.   Req sent to Dr. Pamella Pert

## 2017-08-21 NOTE — Telephone Encounter (Signed)
done

## 2017-08-21 NOTE — Addendum Note (Signed)
Addended by: Rutherford Guys on: 08/21/2017 06:31 PM   Modules accepted: Orders

## 2017-08-26 DIAGNOSIS — Z79899 Other long term (current) drug therapy: Secondary | ICD-10-CM | POA: Diagnosis not present

## 2017-08-26 DIAGNOSIS — M1711 Unilateral primary osteoarthritis, right knee: Secondary | ICD-10-CM | POA: Diagnosis not present

## 2017-08-26 DIAGNOSIS — Z6829 Body mass index (BMI) 29.0-29.9, adult: Secondary | ICD-10-CM | POA: Diagnosis not present

## 2017-08-26 DIAGNOSIS — G894 Chronic pain syndrome: Secondary | ICD-10-CM | POA: Diagnosis not present

## 2017-08-26 DIAGNOSIS — M150 Primary generalized (osteo)arthritis: Secondary | ICD-10-CM | POA: Diagnosis not present

## 2017-08-26 DIAGNOSIS — E663 Overweight: Secondary | ICD-10-CM | POA: Diagnosis not present

## 2017-08-26 DIAGNOSIS — M0579 Rheumatoid arthritis with rheumatoid factor of multiple sites without organ or systems involvement: Secondary | ICD-10-CM | POA: Diagnosis not present

## 2017-08-26 DIAGNOSIS — M791 Myalgia, unspecified site: Secondary | ICD-10-CM | POA: Diagnosis not present

## 2017-08-26 DIAGNOSIS — M25511 Pain in right shoulder: Secondary | ICD-10-CM | POA: Diagnosis not present

## 2017-08-26 MED FILL — predniSONE 5 MG TABS: 5 | 30 days supply | Qty: 30 | Fill #0

## 2017-08-28 MED FILL — ALPRAZolam 0.5 MG TABS: 0.5 | 30 days supply | Qty: 120 | Fill #1

## 2017-09-01 MED FILL — BUPRENORPHINE 15 MCG/HR PTW: 15 | 28 days supply | Qty: 4 | Fill #0

## 2017-09-03 MED FILL — FOLIC ACID 1 MG TABS: 1 | 30 days supply | Qty: 60 | Fill #0

## 2017-09-03 MED FILL — DICLOFENAC SODIUM 1% GEL: 1 | 30 days supply | Qty: 200 | Fill #3

## 2017-09-03 MED FILL — OMEPRAZOLE 20 MG CPDR: 20 | 30 days supply | Qty: 60 | Fill #7

## 2017-09-03 MED FILL — TADALAFIL 5 MG TABS: 5 | 30 days supply | Qty: 30 | Fill #2

## 2017-09-03 MED FILL — SERTRALINE HCL 100 MG TAB: 100 | 30 days supply | Qty: 30 | Fill #1

## 2017-09-05 MED FILL — ENBREL 50 MG/ML SURECLICK S: 50 | 28 days supply | Qty: 4 | Fill #1

## 2017-09-18 DIAGNOSIS — Z79899 Other long term (current) drug therapy: Secondary | ICD-10-CM | POA: Diagnosis not present

## 2017-09-18 DIAGNOSIS — Z6830 Body mass index (BMI) 30.0-30.9, adult: Secondary | ICD-10-CM | POA: Diagnosis not present

## 2017-09-18 DIAGNOSIS — M1711 Unilateral primary osteoarthritis, right knee: Secondary | ICD-10-CM | POA: Diagnosis not present

## 2017-09-18 DIAGNOSIS — E669 Obesity, unspecified: Secondary | ICD-10-CM | POA: Diagnosis not present

## 2017-09-18 DIAGNOSIS — M150 Primary generalized (osteo)arthritis: Secondary | ICD-10-CM | POA: Diagnosis not present

## 2017-09-18 DIAGNOSIS — G894 Chronic pain syndrome: Secondary | ICD-10-CM | POA: Diagnosis not present

## 2017-09-18 DIAGNOSIS — M25511 Pain in right shoulder: Secondary | ICD-10-CM | POA: Diagnosis not present

## 2017-09-18 DIAGNOSIS — M791 Myalgia, unspecified site: Secondary | ICD-10-CM | POA: Diagnosis not present

## 2017-09-18 DIAGNOSIS — M0579 Rheumatoid arthritis with rheumatoid factor of multiple sites without organ or systems involvement: Secondary | ICD-10-CM | POA: Diagnosis not present

## 2017-09-25 MED FILL — ALPRAZolam 0.5 MG TABS: 0.5 | 30 days supply | Qty: 120 | Fill #2

## 2017-09-25 MED FILL — TADALAFIL 5 MG TABS: 5 | 30 days supply | Qty: 30 | Fill #3

## 2017-09-25 MED FILL — FOLIC ACID 1 MG TABS: 1 | 30 days supply | Qty: 60 | Fill #0

## 2017-09-25 MED FILL — SERTRALINE HCL 100 MG TAB: 100 | 30 days supply | Qty: 30 | Fill #2

## 2017-09-25 MED FILL — OMEPRAZOLE 20 MG CPDR: 20 | 30 days supply | Qty: 60 | Fill #8

## 2017-09-26 MED FILL — ENBREL 50 MG/ML SURECLICK S: 50 | 28 days supply | Qty: 4 | Fill #2

## 2017-09-27 MED FILL — BUPRENORPHINE 15 MCG/HR PTW: 15 | 28 days supply | Qty: 4 | Fill #0

## 2017-10-17 ENCOUNTER — Other Ambulatory Visit: Payer: Self-pay

## 2017-10-17 ENCOUNTER — Ambulatory Visit (INDEPENDENT_AMBULATORY_CARE_PROVIDER_SITE_OTHER): Payer: No Typology Code available for payment source | Admitting: Family Medicine

## 2017-10-17 ENCOUNTER — Encounter: Payer: Self-pay | Admitting: Family Medicine

## 2017-10-17 VITALS — BP 148/81 | HR 78 | Temp 98.0°F | Resp 17 | Ht 68.11 in | Wt 194.4 lb

## 2017-10-17 DIAGNOSIS — Z86010 Personal history of colonic polyps: Secondary | ICD-10-CM | POA: Diagnosis not present

## 2017-10-17 DIAGNOSIS — F411 Generalized anxiety disorder: Secondary | ICD-10-CM

## 2017-10-17 DIAGNOSIS — Z1211 Encounter for screening for malignant neoplasm of colon: Secondary | ICD-10-CM | POA: Diagnosis not present

## 2017-10-17 DIAGNOSIS — Z79899 Other long term (current) drug therapy: Secondary | ICD-10-CM

## 2017-10-17 MED ORDER — ALPRAZOLAM 0.5 MG PO TABS: 0.5000 mg | ORAL_TABLET | Freq: Four times a day (QID) | ORAL | 5 refills | Status: DC | PRN

## 2017-10-17 MED ORDER — TADALAFIL 5 MG PO TABS: 5.0000 mg | ORAL_TABLET | Freq: Every day | ORAL | 1 refills | Status: DC

## 2017-10-17 MED ORDER — OMEPRAZOLE 20 MG PO CPDR: 20.0000 mg | DELAYED_RELEASE_CAPSULE | Freq: Two times a day (BID) | ORAL | 1 refills | Status: DC

## 2017-10-17 MED ORDER — SERTRALINE HCL 100 MG PO TABS: 100.0000 mg | ORAL_TABLET | Freq: Every day | ORAL | 1 refills | Status: DC

## 2017-10-17 MED FILL — SERTRALINE HCL 100 MG TAB: 100 | 90 days supply | Qty: 90 | Fill #0

## 2017-10-17 MED FILL — TADALAFIL 5 MG TABS: 5 | 90 days supply | Qty: 90 | Fill #0

## 2017-10-17 MED FILL — OMEPRAZOLE 20 MG CPDR: 20 | 45 days supply | Qty: 90 | Fill #0

## 2017-10-17 NOTE — Progress Notes (Signed)
9/26/201911:46 AM  Grant Hunter [birth year], 62 y.o. male [id number]  Chief Complaint  Patient presents with  . GAD    3 month f/u  . Medication Refill    xanax, prilosec, zoloft, cialis    HPI:   Patient is a 62 y.o. male with past medical history significant for RA, BPH, chronic pain, GAD, GERD who presents today for routine followup  Overall he has been doing really well This morning mild joint pain But overall RA and chronic pain have been well controlled Will be adding a medication to help with mtx side effects  Anxiety well controlled on sertraline and xanax Anxiety mostly affected by pain  BPH doing well on cialis, denies any urgency, frequency, hesitation GERD well controlled on prilosec, denies any heartburn, nausea, vomting, abd pain  Last colonoscopy 5 years ago, h/o polyps. Reports normal stools, denies any blood or black tarry stools  Denies any recent illness  Thinks he got flu vaccine at last rheum visit   Fall Risk  10/17/2017 07/17/2017 04/04/2017 03/22/2017  Falls in the past year? No No No No     Depression screen [facility] 2/9 10/17/2017 07/17/2017 04/04/2017  Decreased Interest 0 0 0  Down, Depressed, Hopeless 0 0 0  PHQ - 2 Score 0 0 0    No Known Allergies  Prior to Admission medications   Medication Sig Start Date End Date Taking? Authorizing Provider  albuterol (PROVENTIL HFA;VENTOLIN HFA) 108 (90 Base) MCG/ACT inhaler Inhale 2 puffs into the lungs every 6 (six) hours as needed for wheezing or shortness of breath. 03/22/17  Yes Rutherford Guys, MD  ALPRAZolam [name]) 0.5 MG tablet Take 1 tablet (0.5 mg total) by mouth every 6 (six) hours as needed for anxiety (anxiety). 07/17/17  Yes Rutherford Guys, MD  b complex vitamins capsule Take 1 capsule by mouth daily.   Yes [provider]  buprenorphine (BUTRANS) 10 MCG/HR PTWK patch Place 15 mcg onto the skin once a week. Sunday   Yes [provider]  CALCIUM-MAGNESIUM-ZINC PO Take 1 tablet  by mouth daily.   Yes [provider]  Cholecalciferol (VITAMIN D3) 2000 UNITS capsule Take 2,000 Units by mouth daily.   Yes [provider]  docusate sodium (COLACE) 100 MG capsule Take 100 mg by mouth 2 (two) times daily.   Yes [provider]  erythromycin ophthalmic ointment Place 1 application into the left eye at bedtime. 07/17/17  Yes Rutherford Guys, MD  etanercept (ENBREL SURECLICK) 50 MG/ML injection Inject 0.98 mLs (50 mg total) into the skin once a week. 08/02/17  Yes Tresa Garter, MD  folic acid (FOLVITE) 1 MG tablet Take 1 mg by mouth daily.   Yes [provider]  KRILL OIL PO Take 1 tablet by mouth daily. Reported on 02/01/2015   Yes [provider]  loratadine (CLARITIN) 10 MG tablet Take 10 mg by mouth daily.   Yes [provider]  Melatonin 3 MG TBDP Take 3 mg by mouth at bedtime.   Yes [provider]  METHOTREXATE SODIUM IJ Inject 60 mg into the muscle once a week.    Yes [provider]  omeprazole (PRILOSEC) 20 MG capsule Take 20 mg by mouth 2 (two) times daily before a meal.   Yes [provider]  sertraline (ZOLOFT) 100 MG tablet Take 1 tablet (100 mg total) by mouth daily. 07/16/17  Yes Rutherford Guys, MD  tadalafil (CIALIS) 5 MG tablet Take  1 tablet (5 mg total) by mouth at bedtime. 04/04/17  Yes Rutherford Guys, MD  Tofacitinib Citrate (XELJANZ) 5 MG TABS Take 5 mg by mouth 2 (two) times daily.   Yes [provider]  VITAMIN E PO Take 1 tablet by mouth daily.   Yes [provider]    Past Medical History:  Diagnosis Date  . Allergy   . Anxiety   . Colon polyps   . Enlarged prostate   . GERD (gastroesophageal reflux disease)   . Hyperlipidemia   . Legally blind in left eye, as defined in [place]    at age 13, from infection, has left eye prosthesis  . Pneumonia   . Rheumatoid arthritis ([place])    Dr Lincoln Maxin, [facility]    Past Surgical History:  Procedure  Laterality Date  . COLONOSCOPY    . dental implants    . EYE SURGERY     parasitic infection left eye  . FRACTURE SURGERY    . SHOULDER ARTHROSCOPY WITH SUBACROMIAL DECOMPRESSION AND OPEN ROTATOR C Left 11/09/2013   Procedure: LEFT SHOULDER ARTHROSCOPY WITH SUBACROMIAL DECOMPRESSION AND OPEN BICEP TENODESIS, OPEN DISTAL CLAVICLE RESECTION ,open rotator cuff repair ,Debridement slap tear ;  Surgeon: Augustin Schooling, MD;  Location: [facility];  Service: Orthopedics;  Laterality: Left;  . TONSILLECTOMY      Social History   Tobacco Use  . Smoking status: Former Smoker    Last attempt to quit: 11/06/1998    Years since quitting: 18.9  . Smokeless tobacco: Never Used  Substance Use Topics  . Alcohol use: Yes    Comment: social drinker    Family History  Problem Relation Age of Onset  . Breast cancer Mother     ROS Per hpi  OBJECTIVE:  Blood pressure (!) 148/81, pulse 78, temperature 98 F (36.7 C), temperature source Oral, resp. rate 17, height 5' 8.11" (1.73 m), weight 194 lb 6.4 oz (88.2 kg), SpO2 95 %. Body mass index is 29.46 kg/m.   Physical Exam  Constitutional: He is oriented to person, place, and time. He appears well-developed and well-nourished.  HENT:  Head: Normocephalic and atraumatic.  Mouth/Throat: Oropharynx is clear and moist.  Eyes: Pupils are equal, round, and reactive to light. Conjunctivae and EOM are normal.  Neck: Neck supple.  Pulmonary/Chest: Effort normal.  Neurological: He is alert and oriented to person, place, and time.  Skin: Skin is warm and dry.  Psychiatric: He has a normal mood and affect.  Nursing note and vitals reviewed.    ASSESSMENT and PLAN  1. Screening for colon cancer - Ambulatory referral to Gastroenterology  2. Hx of colonic polyps - Ambulatory referral to Gastroenterology  3. Generalized anxiety disorder - ToxASSURE Select 13 (MW), Urine  4. Encounter for long term benzodiazepine therapy - ToxASSURE Select 13 (MW),  Urine  Chronic medical conditions stable. Cont current regimes. meds refilled. pmp reviewed. UDS collected today. He will see if indeed he got flu vaccine at rheum office.   Other orders - ALPRAZolam (XANAX) 0.5 MG tablet; Take 1 tablet (0.5 mg total) by mouth every 6 (six) hours as needed for anxiety (anxiety). - omeprazole (PRILOSEC) 20 MG capsule; Take 1 capsule (20 mg total) by mouth 2 (two) times daily before a meal. - sertraline (ZOLOFT) 100 MG tablet; Take 1 tablet (100 mg total) by mouth daily. - tadalafil (CIALIS) 5 MG tablet; Take 1 tablet (5 mg total) by mouth at bedtime.   Return in about  6 months (around 04/17/2018) for CPE.    Rutherford Guys, MD Primary Care at [facility] [place], [place] [address] Ph.  [phone] Fax [phone]

## 2017-10-17 NOTE — Patient Instructions (Signed)
° ° ° °  If you have lab work done today you will be contacted with your lab results within the next 2 weeks.  If you have not heard from us then please contact us. The fastest way to get your results is to register for My Chart. ° ° °IF you received an x-ray today, you will receive an invoice from [facility]. Please contact [facility] at [phone] with questions or concerns regarding your invoice.  ° °IF you received labwork today, you will receive an invoice from [facility]. Please contact [facility] at [phone] with questions or concerns regarding your invoice.  ° °Our billing staff will not be able to assist you with questions regarding bills from these companies. ° °You will be contacted with the lab results as soon as they are available. The fastest way to get your results is to activate your My Chart account. Instructions are located on the last page of this paperwork. If you have not heard from us regarding the results in 2 weeks, please contact this office. °  ° ° ° °

## 2017-10-23 LAB — TOXASSURE SELECT 13 (MW), URINE

## 2017-10-24 MED FILL — ALPRAZolam 0.5 MG TABS: 0.5 | 30 days supply | Qty: 120 | Fill #3

## 2017-10-25 ENCOUNTER — Other Ambulatory Visit: Payer: Self-pay | Admitting: Pharmacist

## 2017-10-25 MED ORDER — ETANERCEPT 50 MG/ML ~~LOC~~ SOAJ: 50.0000 mg | SUBCUTANEOUS | 3 refills | Status: AC

## 2017-10-25 MED FILL — ENBREL SURECLICK 50 MG/ML S: 50 | 28 days supply | Qty: 4 | Fill #0

## 2017-11-01 MED FILL — BUPRENORPHINE 15 MCG/HR PTW: 15 | 30 days supply | Qty: 4 | Fill #0

## 2017-11-01 MED FILL — DICLOFENAC SODIUM 1% GEL: 1 | 30 days supply | Qty: 200 | Fill #4

## 2017-11-22 MED FILL — ENBREL SURECLICK 50 MG/ML S: 50 | 28 days supply | Qty: 4 | Fill #0

## 2017-11-25 MED FILL — ALPRAZolam 0.5 MG TABS: 0.5 | 30 days supply | Qty: 120 | Fill #0

## 2017-11-26 MED FILL — METHOTREXATE 25 MG/ML VIAL: 50 | 90 days supply | Qty: 10 | Fill #0

## 2017-11-26 MED FILL — BUPRENORPHINE 10 MCG/HR PTW: 10 | 28 days supply | Qty: 4 | Fill #0

## 2017-12-02 ENCOUNTER — Telehealth: Payer: Self-pay | Admitting: Gastroenterology

## 2017-12-02 NOTE — Telephone Encounter (Signed)
Received referral for patient to have next colon here. Patients last colon on 10.6.14 with [facility]. Records in care everywhere. Spoke to patients wife who states they are not requesting a certain MD. DOD for referral date 9.26.19 is Dr.Cirigliano. Please review and advise on scheduling.

## 2017-12-13 NOTE — Telephone Encounter (Signed)
I reviewed the notes in Care Everywhere, and appears he is due for routine CRC screening this year. Last colonoscopy was 10/2012 which was normal but with recommendation to repeat in 5 years for ongoing screening. I am certainly happy to see him in the [facility], review his medical history with him, and schedule for colonoscopy. Thanks!

## 2017-12-23 MED FILL — ENBREL SURECLICK 50 MG/ML S: 50 | 28 days supply | Qty: 4 | Fill #1

## 2017-12-23 MED FILL — ALPRAZolam 0.5 MG TABS: 0.5 | 30 days supply | Qty: 120 | Fill #1

## 2017-12-24 ENCOUNTER — Encounter: Payer: Self-pay | Admitting: Gastroenterology

## 2017-12-25 MED FILL — BUPRENORPHINE 10 MCG/HR PTW: 10 | 28 days supply | Qty: 4 | Fill #0

## 2017-12-27 MED FILL — OMEPRAZOLE 20 MG CPDR: 20 | 45 days supply | Qty: 90 | Fill #1

## 2018-01-01 ENCOUNTER — Encounter

## 2018-01-01 ENCOUNTER — Ambulatory Visit (INDEPENDENT_AMBULATORY_CARE_PROVIDER_SITE_OTHER): Payer: No Typology Code available for payment source | Admitting: Gastroenterology

## 2018-01-01 ENCOUNTER — Encounter: Payer: Self-pay | Admitting: Gastroenterology

## 2018-01-01 VITALS — BP 134/72 | HR 85 | Ht 68.0 in | Wt 191.5 lb

## 2018-01-01 DIAGNOSIS — Z86010 Personal history of colonic polyps: Secondary | ICD-10-CM

## 2018-01-01 DIAGNOSIS — K219 Gastro-esophageal reflux disease without esophagitis: Secondary | ICD-10-CM | POA: Diagnosis not present

## 2018-01-01 MED ORDER — SOD PICOSULFATE-MAG OX-CIT ACD 10-3.5-12 MG-GM -GM/160ML PO SOLN: 1.0000 | ORAL | 0 refills | Status: DC

## 2018-01-01 MED FILL — CLENPIQ 10-3.5-12 MG-GM -GM: 10-3.5-12 M | 1 days supply | Qty: 320 | Fill #0

## 2018-01-01 MED FILL — AMOXICILLIN 500 MG CAPSULE: 500 | 7 days supply | Qty: 14 | Fill #0

## 2018-01-01 NOTE — Patient Instructions (Signed)
If you are age 64 or older, your body mass index should be between 23-30. Your Body mass index is 29.12 kg/m. If this is out of the aforementioned range listed, please consider follow up with your Primary Care Provider.  If you are age 12 or younger, your body mass index should be between 19-25. Your Body mass index is 29.12 kg/m. If this is out of the aformentioned range listed, please consider follow up with your Primary Care Provider.   You have been scheduled for an endoscopy and colonoscopy. Please follow the written instructions given to you at your visit today. Please pick up your prep supplies at the pharmacy within the next 1-3 days. If you use inhalers (even only as needed), please bring them with you on the day of your procedure. Your physician has requested that you go to www.startemmi.com and enter the access code given to you at your visit today. This web site gives a general overview about your procedure. However, you should still follow specific instructions given to you by our office regarding your preparation for the procedure.  We have sent the following medications to your pharmacy for you to pick up at your convenience: Clenpiq  For more information on the TIF procedure please visit the website at [email or web address]  It was a pleasure to see you today!  Vito Cirigliano, D.O.

## 2018-01-01 NOTE — Progress Notes (Signed)
Chief Complaint: Colon cancer screening   Referring Provider:     Rutherford Guys, MD    HPI:     Grant Hunter is a 62 y.o. male referred to the [facility] for evaluation of ongoing colon cancer screening/surveillance.  Last colonoscopy was 10/2012 at [facility] which was normal but with recommendation repeat in 5 years for ongoing screening/surveillance due to prior history of colon polyps.  He is otherwise without lower GI symptoms to include change in bowel habits, hematochezia, melena.  Additionally, he has a long standing hx of reflux that has been well controlled with omeprazole 20 mg PO BID, but wishes to discuss alternate modalities for reflux control with a goal to stop or significantly reduce acid suppression therapy.  Index symptoms include heartburn, regurgitation.  Additionally has a prior history of dysphagia with EGD with esophageal dilation approximately 8 years ago with resolution of that dysphagia.  Otherwise, no early satiety, weight loss, fever, chills.  Tolerates all p.o. intake without issue.  Of note, he is scheduled to depart on a one-week cruise in 3 days.  Endoscopic history: - Colonoscopy (10/2012, Dr. Mia Creek): Normal.  Recommended repeat in 5 years due to prior history of colon polyps - Colonoscopy at age 57 (approximately 2007) with polyps with recommendation to repeat in 5 years.  No report available for review. -EGD approx 8 years ago with esophageal dilation with durable relief.    Past Medical History:  Diagnosis Date  . Allergy   . Anxiety   . Arthritis   . Colon polyps   . Enlarged prostate   . Esophageal stricture   . GERD (gastroesophageal reflux disease)   . Hyperlipidemia   . Legally blind in left eye, as defined in [place]    at age 77, from infection, has left eye prosthesis  . Pneumonia   . Rheumatoid arthritis ([place])    Dr Lincoln Maxin, [facility]     Past Surgical History:  Procedure Laterality Date  .  COLONOSCOPY  2014  . dental implants    . ESOPHAGOGASTRODUODENOSCOPY     around 2010  . EYE SURGERY     parasitic infection left eye  . FRACTURE SURGERY    . SHOULDER ARTHROSCOPY WITH SUBACROMIAL DECOMPRESSION AND OPEN ROTATOR C Left 11/09/2013   Procedure: LEFT SHOULDER ARTHROSCOPY WITH SUBACROMIAL DECOMPRESSION AND OPEN BICEP TENODESIS, OPEN DISTAL CLAVICLE RESECTION ,open rotator cuff repair ,Debridement slap tear ;  Surgeon: Augustin Schooling, MD;  Location: [facility];  Service: Orthopedics;  Laterality: Left;  . TONSILLECTOMY     Family History  Problem Relation Age of Onset  . Breast cancer Mother   . Colon cancer Neg Hx   . Esophageal cancer Neg Hx    Social History   Tobacco Use  . Smoking status: Former Research scientist (life sciences)  . Smokeless tobacco: Never Used  . Tobacco comment: quit 30 years ago   Substance Use Topics  . Alcohol use: Yes    Comment: social drinker  . Drug use: No   Current Outpatient Medications  Medication Sig Dispense Refill  . ALPRAZolam (XANAX) 0.5 MG tablet Take 1 tablet (0.5 mg total) by mouth every 6 (six) hours as needed for anxiety (anxiety). 120 tablet 5  . b complex vitamins capsule Take 1 capsule by mouth daily.    Marland Kitchen CALCIUM-MAGNESIUM-ZINC PO Take 1 tablet by mouth daily.    . Cholecalciferol (VITAMIN D3) 2000 UNITS  capsule Take 2,000 Units by mouth daily.    . Diclofenac Sodium 1 % CREA Place onto the skin as needed.    Marland Kitchen erythromycin ophthalmic ointment Place 1 application into the left eye at bedtime. (Patient taking differently: Place 1 application into the left eye as needed. ) 3.5 g 1  . etanercept (ENBREL SURECLICK) 50 MG/ML injection Inject 0.98 mLs (50 mg total) into the skin once a week. Inject 1 syringe SQ once a week 4 Syringe 3  . folic acid (FOLVITE) 1 MG tablet Take 1 mg by mouth daily.    Marland Kitchen loratadine (CLARITIN) 10 MG tablet Take 10 mg by mouth daily.    . Melatonin 3 MG TBDP Take 3 mg by mouth at bedtime.    . METHOTREXATE SODIUM IJ Inject 60  mg into the muscle once a week.     Marland Kitchen omeprazole (PRILOSEC) 20 MG capsule Take 1 capsule (20 mg total) by mouth 2 (two) times daily before a meal. 90 capsule 1  . sertraline (ZOLOFT) 100 MG tablet Take 1 tablet (100 mg total) by mouth daily. 90 tablet 1  . tadalafil (CIALIS) 5 MG tablet Take 1 tablet (5 mg total) by mouth at bedtime. 90 tablet 1  . VITAMIN E PO Take 1 tablet by mouth daily.    Marland Kitchen albuterol (PROVENTIL HFA;VENTOLIN HFA) 108 (90 Base) MCG/ACT inhaler Inhale 2 puffs into the lungs every 6 (six) hours as needed for wheezing or shortness of breath. (Patient not taking: Reported on 01/01/2018) 1 Inhaler 0  . buprenorphine (BUTRANS) 10 MCG/HR PTWK patch Place 15 mcg onto the skin once a week. Sunday     No current facility-administered medications for this visit.    No Known Allergies   Review of Systems: All systems reviewed and negative except where noted in HPI.     Physical Exam:    Wt Readings from Last 3 Encounters:  01/01/18 191 lb 8 oz (86.9 kg)  10/17/17 194 lb 6.4 oz (88.2 kg)  07/17/17 190 lb 3.2 oz (86.3 kg)    BP 134/72   Pulse 85   Ht 5\' 8"  (1.727 m)   Wt 191 lb 8 oz (86.9 kg)   BMI 29.12 kg/m  Constitutional:  Pleasant, in no acute distress. Psychiatric: Normal mood and affect. Behavior is normal. EENT: Pupils normal.  Conjunctivae are normal. No scleral icterus. Neck supple. No cervical LAD. Cardiovascular: Normal rate, regular rhythm. No edema Pulmonary/chest: Effort normal and breath sounds normal. No wheezing, rales or rhonchi. Abdominal: Soft, nondistended, nontender. Bowel sounds active throughout. There are no masses palpable. No hepatomegaly. Neurological: Alert and oriented to person place and time. Skin: Skin is warm and dry. No rashes noted.   ASSESSMENT AND PLAN;   Grant Hunter is a 62 y.o. male presenting with:  1) Colon cancer screening/polyp surveillance: Index colonoscopy with polyps of unknown size, number, histology with repeat  colonoscopy in 2014 that was normal but with recommendation to repeat in 5 years for ongoing surveillance.  He is otherwise without lower GI symptoms.  - Repeat colonoscopy now given prior recommendations and previous history of polyps.  Recommendations for ongoing intervals for colonoscopy pending endoscopic findings  2) GERD: Longstanding history of reflux that is well typically well controlled with high-dose acid suppression therapy.  However he wishes to see alternate treatment options with a goal of stopping or significantly reducing need for acid suppression medications.  Discussed the pathophysiology of GERD at length, to include the risks  of untreated reflux (ie, strictures, Barrett's Esophagus, EAC, etc) as well as the possible treatment with medications vs antireflux surgery. In particular, we discussed the risks, benefits, and alternatives of Transoral Incisionless Fundoplication (TIF), to include Nissen fundoplication and Linx, and the patient wishes to proceed with the pre-operative evaluation for TIF. Will proceed as below:   - EGD to evaluate for objective e/o reflux (ie, reflux esophagitis, SSND Barrett's, etc) and r/o contraindications of TIF (ie, large hiatal hernia, Hill classification 3 or higher, LA Grade C or D esophagitis, EoE, etc)  - Resume acid suppression therapy for now  - If necessary, may need to refer for pH/MII testing  - Discussed the strict post-procedure diet, to include clears x24 hours then liquids x2 weeks and slow advancement to pureed, thick, then finally previous foods 6 weeks post operatively  - Discussed the activity limitations for the initial 6 weeks post operatively  - Provided with patient handout and directed patient to informative video regarding the TIF procedure at [email or web address]  - To follow-up with me in the GI clinic following evaluation as above to discuss potential TIF as indicated vs alternate long term treatment strategies - All  questions answered  The indications, risks, and benefits of EGD and colonoscopy were explained to the patient in detail. Risks include but are not limited to bleeding, perforation, adverse reaction to medications, and cardiopulmonary compromise. Sequelae include but are not limited to the possibility of surgery, hositalization, and mortality. The patient verbalized understanding and wished to proceed. All questions answered, referred to scheduler and bowel prep ordered. Further recommendations pending results of the exam.     Lavena Bullion, DO, FACG  01/01/2018, 2:01 PM   Rutherford Guys, MD

## 2018-01-10 MED FILL — DICLOFENAC SODIUM 1% GEL: 1 | 30 days supply | Qty: 200 | Fill #5

## 2018-01-10 MED FILL — traMADol HCL 50 MG TABS: 50 | 22 days supply | Qty: 90 | Fill #1

## 2018-01-10 MED FILL — SERTRALINE HCL 100 MG TAB: 100 | 90 days supply | Qty: 90 | Fill #1

## 2018-01-10 MED FILL — TADALAFIL 5 MG TABS: 5 | 90 days supply | Qty: 90 | Fill #1

## 2018-01-16 MED FILL — ENBREL SURECLICK 50 MG/ML S: 50 | 28 days supply | Qty: 4 | Fill #2

## 2018-01-21 MED FILL — BUPRENORPHINE 10 MCG/HR PTW: 10 | 28 days supply | Qty: 4 | Fill #0

## 2018-01-24 ENCOUNTER — Encounter: Payer: Self-pay | Admitting: Gastroenterology

## 2018-01-30 MED FILL — ALPRAZolam 0.5 MG TABS: 0.5 | 30 days supply | Qty: 120 | Fill #2

## 2018-02-04 ENCOUNTER — Encounter: Payer: Medicare Other | Admitting: Gastroenterology

## 2018-02-06 ENCOUNTER — Encounter: Payer: Self-pay | Admitting: Gastroenterology

## 2018-02-06 ENCOUNTER — Ambulatory Visit (AMBULATORY_SURGERY_CENTER): Payer: Medicare Other | Admitting: Gastroenterology

## 2018-02-06 VITALS — BP 128/67 | HR 61 | Temp 97.1°F | Resp 13 | Ht 68.0 in | Wt 191.0 lb

## 2018-02-06 DIAGNOSIS — D124 Benign neoplasm of descending colon: Secondary | ICD-10-CM | POA: Diagnosis not present

## 2018-02-06 DIAGNOSIS — D128 Benign neoplasm of rectum: Secondary | ICD-10-CM | POA: Diagnosis not present

## 2018-02-06 DIAGNOSIS — Z1211 Encounter for screening for malignant neoplasm of colon: Secondary | ICD-10-CM | POA: Diagnosis not present

## 2018-02-06 DIAGNOSIS — D120 Benign neoplasm of cecum: Secondary | ICD-10-CM | POA: Diagnosis not present

## 2018-02-06 DIAGNOSIS — Z86010 Personal history of colonic polyps: Secondary | ICD-10-CM | POA: Diagnosis not present

## 2018-02-06 DIAGNOSIS — D127 Benign neoplasm of rectosigmoid junction: Secondary | ICD-10-CM | POA: Diagnosis not present

## 2018-02-06 DIAGNOSIS — K222 Esophageal obstruction: Secondary | ICD-10-CM

## 2018-02-06 DIAGNOSIS — K219 Gastro-esophageal reflux disease without esophagitis: Secondary | ICD-10-CM

## 2018-02-06 DIAGNOSIS — K449 Diaphragmatic hernia without obstruction or gangrene: Secondary | ICD-10-CM

## 2018-02-06 MED ORDER — SODIUM CHLORIDE 0.9 % IV SOLN: 500.0000 mL | Freq: Once | INTRAVENOUS | Status: DC

## 2018-02-06 NOTE — Patient Instructions (Signed)
INFORMATION ON POLYPS HEMORRHOIDS AND DIVERTICULOSIS GIVEN.  YOU HAD AN ENDOSCOPIC PROCEDURE TODAY AT [facility]:   Refer to the procedure report that was given to you for any specific questions about what was found during the examination.  If the procedure report does not answer your questions, please call your gastroenterologist to clarify.  If you requested that your care partner not be given the details of your procedure findings, then the procedure report has been included in a sealed envelope for you to review at your convenience later.  YOU SHOULD EXPECT: Some feelings of bloating in the abdomen. Passage of more gas than usual.  Walking can help get rid of the air that was put into your GI tract during the procedure and reduce the bloating. If you had a lower endoscopy (such as a colonoscopy or flexible sigmoidoscopy) you may notice spotting of blood in your stool or on the toilet paper. If you underwent a bowel prep for your procedure, you may not have a normal bowel movement for a few days.  Please Note:  You might notice some irritation and congestion in your nose or some drainage.  This is from the oxygen used during your procedure.  There is no need for concern and it should clear up in a day or so.  SYMPTOMS TO REPORT IMMEDIATELY:   Following lower endoscopy (colonoscopy or flexible sigmoidoscopy):  Excessive amounts of blood in the stool  Significant tenderness or worsening of abdominal pains  Swelling of the abdomen that is new, acute  Fever of 100F or higher   Following upper endoscopy (EGD)  Vomiting of blood or coffee ground material  New chest pain or pain under the shoulder blades  Painful or persistently difficult swallowing  New shortness of breath  Fever of 100F or higher  Black, tarry-looking stools  For urgent or emergent issues, a gastroenterologist can be reached at any hour by calling [phone].   DIET:  We do recommend a small meal at  first, but then you may proceed to your regular diet.  Drink plenty of fluids but you should avoid alcoholic beverages for 24 hours.  ACTIVITY:  You should plan to take it easy for the rest of today and you should NOT DRIVE or use heavy machinery until tomorrow (because of the sedation medicines used during the test).    FOLLOW UP: Our staff will call the number listed on your records the next business day following your procedure to check on you and address any questions or concerns that you may have regarding the information given to you following your procedure. If we do not reach you, we will leave a message.  However, if you are feeling well and you are not experiencing any problems, there is no need to return our call.  We will assume that you have returned to your regular daily activities without incident.  If any biopsies were taken you will be contacted by phone or by letter within the next 1-3 weeks.  Please call us at [phone] if you have not heard about the biopsies in 3 weeks.    SIGNATURES/CONFIDENTIALITY: You and/or your care partner have signed paperwork which will be entered into your electronic medical record.  These signatures attest to the fact that that the information above on your After Visit Summary has been reviewed and is understood.  Full responsibility of the confidentiality of this discharge information lies with you and/or your care-partner.

## 2018-02-06 NOTE — Progress Notes (Signed)
Called to room to assist during endoscopic procedure.  Patient ID and intended procedure confirmed with present staff. Received instructions for my participation in the procedure from the performing physician.  

## 2018-02-06 NOTE — Op Note (Signed)
[facility] Patient Name: Grant Hunter Procedure Date: 02/06/2018 2:04 PM MRN: [id number] Endoscopist: Gerrit Heck , MD Age: 63 Referring MD:  Date of Birth: 02/22/1955 Gender: Male Account #: [id number] Procedure:                Colonoscopy Indications:              Surveillance: Personal history of colonic polyps                            (unknown histology) on last colonoscopy more than 5                            years ago Medicines:                Monitored Anesthesia Care Procedure:                Pre-Anesthesia Assessment:                           - Prior to the procedure, a History and Physical                            was performed, and patient medications and                            allergies were reviewed. The patient's tolerance of                            previous anesthesia was also reviewed. The risks                            and benefits of the procedure and the sedation                            options and risks were discussed with the patient.                            All questions were answered, and informed consent                            was obtained. Prior Anticoagulants: The patient has                            taken no previous anticoagulant or antiplatelet                            agents. ASA Grade Assessment: II - A patient with                            mild systemic disease. After reviewing the risks                            and benefits, the patient was deemed in  satisfactory condition to undergo the procedure.                           After obtaining informed consent, the colonoscope                            was passed under direct vision. Throughout the                            procedure, the patient's blood pressure, pulse, and                            oxygen saturations were monitored continuously. The                            Colonoscope was introduced through the anus and                             advanced to the the cecum, identified by                            appendiceal orifice and ileocecal valve. The                            colonoscopy was performed without difficulty. The                            patient tolerated the procedure well. The quality                            of the bowel preparation was fair. Scope In: 2:22:46 PM Scope Out: 2:45:11 PM Scope Withdrawal Time: 0 hours 20 minutes 43 seconds  Total Procedure Duration: 0 hours 22 minutes 25 seconds  Findings:                 The perianal and digital rectal examinations were                            normal.                           A 6 mm polyp was found in the cecum. The polyp was                            sessile. The polyp was removed with a cold snare.                            Resection and retrieval were complete. Estimated                            blood loss was minimal.                           Three sessile polyps were found in the descending  colon. The polyps were 2 to 4 mm in size. These                            polyps were removed with a cold snare and cold                            forceps. Resection and retrieval were complete.                            Estimated blood loss was minimal.                           A 6 mm polyp was found in the recto-sigmoid colon.                            The polyp was sessile. The polyp was removed with a                            cold biopsy forceps. Resection and retrieval were                            complete. Estimated blood loss was minimal.                           A 3 mm polyp was found in the rectum. The polyp was                            sessile. The polyp was removed with a cold snare.                            Resection and retrieval were complete. Estimated                            blood loss was minimal.                           Non-bleeding internal hemorrhoids were found during                             retroflexion. The hemorrhoids were small.                           A moderate amount of semi-liquid stool was found in                            the entire colon, interfering with visualization.                            Lavage of the area was performed using copious                            amounts of sterile water, resulting in clearance  with fair visualization. Complications:            No immediate complications. Estimated Blood Loss:     Estimated blood loss was minimal. Impression:               - Preparation of the colon was fair.                           - One 6 mm polyp in the cecum, removed with a cold                            snare. Resected and retrieved.                           - Three 2 to 4 mm polyps in the descending colon,                            removed with a cold snare. Resected and retrieved.                           - One 2 mm polyp at the recto-sigmoid colon,                            removed with a cold biopsy forceps. Resected and                            retrieved.                           - One 3 mm polyp in the rectum, removed with a cold                            snare. Resected and retrieved.                           - Non-bleeding internal hemorrhoids.                           - Stool in the entire examined colon. Recommendation:           - Patient has a contact number available for                            emergencies. The signs and symptoms of potential                            delayed complications were discussed with the                            patient. Return to normal activities tomorrow.                            Written discharge instructions were provided to the  patient.                           - Resume previous diet today.                           - Continue present medications.                           - Await pathology results.                            - Repeat colonoscopy in 1 year because the bowel                            preparation was suboptimal and for surveillance. Gerrit Heck, MD 02/06/2018 3:13:18 PM

## 2018-02-06 NOTE — Progress Notes (Signed)
PT taken to PACU. Monitors in place. VSS. Report given to RN. 

## 2018-02-06 NOTE — Op Note (Signed)
[facility] Patient Name: Grant Hunter Procedure Date: 02/06/2018 2:04 PM MRN: [id number] Endoscopist: Gerrit Heck , MD Age: 63 Referring MD:  Date of Birth: [birth year] Gender: Male Account #: [id number] Procedure:                Upper GI endoscopy Indications:              Esophageal reflux, Preoperative assessment,                            Suspected hiatal hernia                           63 yo male with a long standing history of reflux,                            currently controlled on acid suppression therapy,                            but interested in endoscopic antireflux procedure,                            presents for evaluation of erosive esophagitis,                            hiatal hernia, LES laxity, and preoperative                            evaluation. Medicines:                Monitored Anesthesia Care Procedure:                Pre-Anesthesia Assessment:                           - Prior to the procedure, a History and Physical                            was performed, and patient medications and                            allergies were reviewed. The patient's tolerance of                            previous anesthesia was also reviewed. The risks                            and benefits of the procedure and the sedation                            options and risks were discussed with the patient.                            All questions were answered, and informed consent  was obtained. Prior Anticoagulants: The patient has                            taken no previous anticoagulant or antiplatelet                            agents. ASA Grade Assessment: II - A patient with                            mild systemic disease. After reviewing the risks                            and benefits, the patient was deemed in                            satisfactory condition to undergo the procedure.                           After  obtaining informed consent, the endoscope was                            passed under direct vision. Throughout the                            procedure, the patient's blood pressure, pulse, and                            oxygen saturations were monitored continuously. The                            Model GIF-HQ190 [id number]) scope was introduced                            through the mouth, and advanced to the second part                            of duodenum. The upper GI endoscopy was                            accomplished without difficulty. The patient                            tolerated the procedure well. Scope In: Scope Out: Findings:                 Esophagogastric landmarks were identified: the                            Z-line was found at 38 cm, the gastroesophageal                            junction was found at 38 cm and the site of hiatal  narrowing was found at 40 cm from the incisors.                           A 2 cm axial height hiatal hernia was present. On                            retroflexed views, the hernia was at maximum 2.5 cm                            during deep inspiration and belching.                           The Z-line was regular and was found 38 cm from the                            incisors.                           A non-obstructing Schatzki ring was found in the                            lower third of the esophagus.                           The upper third of the esophagus and middle third                            of the esophagus were normal.                           The gastroesophageal flap valve was visualized                            endoscopically and classified as Hill Grade II                            (fold present, opens with respiration).                           The gastric fundus, gastric body, incisura, gastric                            antrum and pylorus were normal.                            The duodenal bulb, first portion of the duodenum                            and second portion of the duodenum were normal. Complications:            No immediate complications. Estimated Blood Loss:     Estimated blood loss: none. Impression:               - Esophagogastric landmarks identified.                           -  2 cm axial height and 2.5 cm transverse width                            hiatal hernia.                           - Z-line regular, 38 cm from the incisors.                           - Non-obstructing Schatzki ring.                           - Normal upper third of esophagus and middle third                            of esophagus.                           - Gastroesophageal flap valve classified as Hill                            Grade II (fold present, opens with respiration).                           - Normal gastric fundus, gastric body, incisura,                            antrum and pylorus.                           - Normal duodenal bulb, first portion of the                            duodenum and second portion of the duodenum.                           - No specimens collected. Recommendation:           - Patient has a contact number available for                            emergencies. The signs and symptoms of potential                            delayed complications were discussed with the                            patient. Return to normal activities tomorrow.                            Written discharge instructions were provided to the                            patient.                           -  Resume previous diet today.                           - Continue present medications.                           - Perform Esophageal Manometry and ambulatory pH                            and impedance monitoring at appointment to be                            scheduled. Test to be done OFF PPI therapy for 7                            days prior to  the study.                           - Return to GI clinic after studies are complete. Gerrit Heck, MD 02/06/2018 3:04:37 PM

## 2018-02-07 ENCOUNTER — Telehealth: Payer: Self-pay

## 2018-02-07 NOTE — Telephone Encounter (Signed)
  Follow up Call-  Call back number 02/06/2018  Post procedure Call Back phone  # [phone]  Permission to leave phone message Yes  Some recent data might be hidden     Patient questions:  Do you have a fever, pain , or abdominal swelling? No. Pain Score  0 *  Have you tolerated food without any problems? Yes.    Have you been able to return to your normal activities? Yes.    Do you have any questions about your discharge instructions: Diet   No. Medications  No. Follow up visit  No.  Do you have questions or concerns about your Care? No.  Actions: * If pain score is 4 or above: No action needed, pain <4.

## 2018-02-12 ENCOUNTER — Encounter: Payer: Self-pay | Admitting: Gastroenterology

## 2018-02-13 ENCOUNTER — Other Ambulatory Visit: Payer: Self-pay

## 2018-02-13 DIAGNOSIS — K2210 Ulcer of esophagus without bleeding: Secondary | ICD-10-CM

## 2018-02-13 DIAGNOSIS — K219 Gastro-esophageal reflux disease without esophagitis: Secondary | ICD-10-CM

## 2018-02-13 NOTE — Progress Notes (Unsigned)
Esophageal manometry and ambulatory pH and impedance scheduled on 03/10/2018 at 10:30 am; patient was sent a message via MyChart; a letter with instructions was also mailed; Called and spoke with patient-patient informed of procedure that has been scheduled; patient is agreeable with plan of care; Patient verbalized understanding of information/instructions; Patient was advised to call back if questions/concerns arise;

## 2018-02-17 ENCOUNTER — Other Ambulatory Visit: Payer: Self-pay | Admitting: Family Medicine

## 2018-02-18 MED FILL — OMEPRAZOLE 20 MG CPDR: 20 | 45 days supply | Qty: 90 | Fill #0

## 2018-02-21 ENCOUNTER — Other Ambulatory Visit: Payer: Self-pay | Admitting: Family Medicine

## 2018-02-24 MED FILL — TADALAFIL 5 MG TABS: 5 | 90 days supply | Qty: 90 | Fill #0

## 2018-02-26 DIAGNOSIS — Z79899 Other long term (current) drug therapy: Secondary | ICD-10-CM | POA: Diagnosis not present

## 2018-02-26 DIAGNOSIS — M0579 Rheumatoid arthritis with rheumatoid factor of multiple sites without organ or systems involvement: Secondary | ICD-10-CM | POA: Diagnosis not present

## 2018-02-26 DIAGNOSIS — M25511 Pain in right shoulder: Secondary | ICD-10-CM | POA: Diagnosis not present

## 2018-02-26 DIAGNOSIS — M150 Primary generalized (osteo)arthritis: Secondary | ICD-10-CM | POA: Diagnosis not present

## 2018-02-26 DIAGNOSIS — M1711 Unilateral primary osteoarthritis, right knee: Secondary | ICD-10-CM | POA: Diagnosis not present

## 2018-02-28 MED FILL — ALPRAZolam 0.5 MG TABS: 0.5 | 30 days supply | Qty: 120 | Fill #3

## 2018-03-10 ENCOUNTER — Encounter (HOSPITAL_COMMUNITY)
Admission: RE | Disposition: A | Discharge status: Home / Self Care | Payer: Self-pay | Source: Home / Self Care | Attending: Gastroenterology

## 2018-03-10 ENCOUNTER — Ambulatory Visit (HOSPITAL_COMMUNITY)
Admission: RE | Admit: 2018-03-10 | Discharge: 2018-03-10 | Disposition: A | Discharge status: Home / Self Care | Payer: Medicare Other | Attending: Gastroenterology | Admitting: Gastroenterology

## 2018-03-10 DIAGNOSIS — R12 Heartburn: Secondary | ICD-10-CM | POA: Insufficient documentation

## 2018-03-10 DIAGNOSIS — K219 Gastro-esophageal reflux disease without esophagitis: Secondary | ICD-10-CM | POA: Diagnosis not present

## 2018-03-10 HISTORY — PX: PH IMPEDANCE STUDY: SHX5565

## 2018-03-10 HISTORY — PX: ESOPHAGEAL MANOMETRY: SHX5429

## 2018-03-10 SURGERY — MANOMETRY, ESOPHAGUS
Anesthesia: Choice

## 2018-03-10 MED ORDER — LIDOCAINE VISCOUS HCL 2 % MT SOLN
OROMUCOSAL | Status: AC
  Filled 2018-03-10: qty 15

## 2018-03-10 SURGICAL SUPPLY — 2 items
FACESHIELD LNG OPTICON STERILE (SAFETY) IMPLANT
GLOVE BIO SURGEON STRL SZ8 (GLOVE) ×6 IMPLANT

## 2018-03-10 NOTE — Progress Notes (Signed)
Esophageal Manometry done per protocol. Pt tolerated well without distress or complication. Ph probe inserted per protocol at 35.5cm taped securely. Using teach back went over instructions with pt regarding study and monitor. Pt voiced understanding and demonstrated use of monitor. Pt will return to unit to have probe removed and monitor downloaded tomorrow at 11;10 or after.

## 2018-03-13 ENCOUNTER — Encounter (HOSPITAL_COMMUNITY): Payer: Self-pay | Admitting: Gastroenterology

## 2018-03-21 DIAGNOSIS — K219 Gastro-esophageal reflux disease without esophagitis: Secondary | ICD-10-CM

## 2018-03-28 MED FILL — ALPRAZolam 0.5 MG TABS: 0.5 | 30 days supply | Qty: 120 | Fill #4

## 2018-03-28 MED FILL — SERTRALINE HCL 100 MG TAB: 100 | 30 days supply | Qty: 30 | Fill #3

## 2018-04-11 ENCOUNTER — Ambulatory Visit (INDEPENDENT_AMBULATORY_CARE_PROVIDER_SITE_OTHER): Payer: No Typology Code available for payment source | Admitting: Family Medicine

## 2018-04-11 ENCOUNTER — Encounter: Payer: Self-pay | Admitting: Family Medicine

## 2018-04-11 ENCOUNTER — Other Ambulatory Visit: Payer: Self-pay

## 2018-04-11 VITALS — BP 122/72 | HR 75 | Temp 97.8°F | Resp 17 | Ht 68.0 in | Wt 189.0 lb

## 2018-04-11 DIAGNOSIS — Z0001 Encounter for general adult medical examination with abnormal findings: Secondary | ICD-10-CM

## 2018-04-11 DIAGNOSIS — Z86010 Personal history of colonic polyps: Secondary | ICD-10-CM

## 2018-04-11 DIAGNOSIS — E785 Hyperlipidemia, unspecified: Secondary | ICD-10-CM | POA: Diagnosis not present

## 2018-04-11 DIAGNOSIS — Z0000 Encounter for general adult medical examination without abnormal findings: Secondary | ICD-10-CM

## 2018-04-11 DIAGNOSIS — M069 Rheumatoid arthritis, unspecified: Secondary | ICD-10-CM

## 2018-04-11 DIAGNOSIS — Z79899 Other long term (current) drug therapy: Secondary | ICD-10-CM

## 2018-04-11 DIAGNOSIS — N400 Enlarged prostate without lower urinary tract symptoms: Secondary | ICD-10-CM

## 2018-04-11 DIAGNOSIS — Z114 Encounter for screening for human immunodeficiency virus [HIV]: Secondary | ICD-10-CM

## 2018-04-11 DIAGNOSIS — Z7952 Long term (current) use of systemic steroids: Secondary | ICD-10-CM

## 2018-04-11 DIAGNOSIS — F411 Generalized anxiety disorder: Secondary | ICD-10-CM

## 2018-04-11 DIAGNOSIS — Z1159 Encounter for screening for other viral diseases: Secondary | ICD-10-CM | POA: Diagnosis not present

## 2018-04-11 MED ORDER — ERYTHROMYCIN 5 MG/GM OP OINT: 1.0000 "application " | TOPICAL_OINTMENT | Freq: Every day | OPHTHALMIC | 1 refills | Status: AC

## 2018-04-11 MED ORDER — ALPRAZOLAM 0.5 MG PO TABS: 0.5000 mg | ORAL_TABLET | Freq: Four times a day (QID) | ORAL | 5 refills | Status: DC | PRN

## 2018-04-11 MED ORDER — TADALAFIL 5 MG PO TABS: 5.0000 mg | ORAL_TABLET | Freq: Every day | ORAL | 1 refills | Status: DC

## 2018-04-11 MED ORDER — OMEPRAZOLE 20 MG PO CPDR: DELAYED_RELEASE_CAPSULE | ORAL | 3 refills | Status: DC

## 2018-04-11 MED ORDER — SERTRALINE HCL 100 MG PO TABS: 100.0000 mg | ORAL_TABLET | Freq: Every day | ORAL | 1 refills | Status: DC

## 2018-04-11 MED FILL — OMEPRAZOLE 20 MG CPDR: 20 | 90 days supply | Qty: 180 | Fill #0

## 2018-04-11 MED FILL — SERTRALINE HCL 100 MG TAB: 100 | 90 days supply | Qty: 90 | Fill #0

## 2018-04-11 MED FILL — ERYTHROMYCIN 0.5% EYE OINT: 5 | 10 days supply | Qty: 4 | Fill #0

## 2018-04-11 NOTE — Patient Instructions (Addendum)
   If you have lab work done today you will be contacted with your lab results within the next 2 weeks.  If you have not heard from us then please contact us. The fastest way to get your results is to register for My Chart.   IF you received an x-ray today, you will receive an invoice from [facility]. Please contact [facility] at [phone] with questions or concerns regarding your invoice.   IF you received labwork today, you will receive an invoice from [facility]. Please contact [facility] at [phone] with questions or concerns regarding your invoice.   Our billing staff will not be able to assist you with questions regarding bills from these companies.  You will be contacted with the lab results as soon as they are available. The fastest way to get your results is to activate your My Chart account. Instructions are located on the last page of this paperwork. If you have not heard from us regarding the results in 2 weeks, please contact this office.     Preventive Care 40-64 Years, Male Preventive care refers to lifestyle choices and visits with your health care provider that can promote health and wellness. What does preventive care include?   A yearly physical exam. This is also called an annual well check.  Dental exams once or twice a year.  Routine eye exams. Ask your health care provider how often you should have your eyes checked.  Personal lifestyle choices, including: ? Daily care of your teeth and gums. ? Regular physical activity. ? Eating a healthy diet. ? Avoiding tobacco and drug use. ? Limiting alcohol use. ? Practicing safe sex. ? Taking low-dose aspirin every day starting at age 50. What happens during an annual well check? The services and screenings done by your health care provider during your annual well check will depend on your age, overall health, lifestyle risk factors, and family history of disease. Counseling Your health care  provider may ask you questions about your:  Alcohol use.  Tobacco use.  Drug use.  Emotional well-being.  Home and relationship well-being.  Sexual activity.  Eating habits.  Work and work environment. Screening You may have the following tests or measurements:  Height, weight, and BMI.  Blood pressure.  Lipid and cholesterol levels. These may be checked every 5 years, or more frequently if you are over 50 years old.  Skin check.  Lung cancer screening. You may have this screening every year starting at age 55 if you have a 30-pack-year history of smoking and currently smoke or have quit within the past 15 years.  Colorectal cancer screening. All adults should have this screening starting at age 50 and continuing until age 75. Your health care provider may recommend screening at age 45. You will have tests every 1-10 years, depending on your results and the type of screening test. People at increased risk should start screening at an earlier age. Screening tests may include: ? Guaiac-based fecal occult blood testing. ? Fecal immunochemical test (FIT). ? Stool DNA test. ? Virtual colonoscopy. ? Sigmoidoscopy. During this test, a flexible tube with a tiny camera (sigmoidoscope) is used to examine your rectum and lower colon. The sigmoidoscope is inserted through your anus into your rectum and lower colon. ? Colonoscopy. During this test, a long, thin, flexible tube with a tiny camera (colonoscope) is used to examine your entire colon and rectum.  Prostate cancer screening. Recommendations will vary depending on your family history and   other risks.  Hepatitis C blood test.  Hepatitis B blood test.  Sexually transmitted disease (STD) testing.  Diabetes screening. This is done by checking your blood sugar (glucose) after you have not eaten for a while (fasting). You may have this done every 1-3 years. Discuss your test results, treatment options, and if necessary, the need  for more tests with your health care provider. Vaccines Your health care provider may recommend certain vaccines, such as:  Influenza vaccine. This is recommended every year.  Tetanus, diphtheria, and acellular pertussis (Tdap, Td) vaccine. You may need a Td booster every 10 years.  Varicella vaccine. You may need this if you have not been vaccinated.  Zoster vaccine. You may need this after age 60.  Measles, mumps, and rubella (MMR) vaccine. You may need at least one dose of MMR if you were born in 1957 or later. You may also need a second dose.  Pneumococcal 13-valent conjugate (PCV13) vaccine. You may need this if you have certain conditions and have not been vaccinated.  Pneumococcal polysaccharide (PPSV23) vaccine. You may need one or two doses if you smoke cigarettes or if you have certain conditions.  Meningococcal vaccine. You may need this if you have certain conditions.  Hepatitis A vaccine. You may need this if you have certain conditions or if you travel or work in places where you may be exposed to hepatitis A.  Hepatitis B vaccine. You may need this if you have certain conditions or if you travel or work in places where you may be exposed to hepatitis B.  Haemophilus influenzae type b (Hib) vaccine. You may need this if you have certain risk factors. Talk to your health care provider about which screenings and vaccines you need and how often you need them. This information is not intended to replace advice given to you by your health care provider. Make sure you discuss any questions you have with your health care provider. Document Released: 02/04/2015 Document Revised: 02/28/2017 Document Reviewed: 11/09/2014 Elsevier Interactive Patient Education  2019 Elsevier Inc.  

## 2018-04-11 NOTE — Progress Notes (Signed)
3/20/202010:16 AM  Grant Hunter 01/25/1955, 63 y.o., male [id number]  Chief Complaint  Patient presents with   Annual Exam    HPI:   Patient is a 63 y.o. male with past medical history significant for RA, BPH, chronic pain, GAD, GERD, L eye prostethic  who presents today for CPE  Last CPE Jan 2019 Colorectal Cancer Screening: Jan 2020, colonoscopy, + polyps, poor prep, repeat in 1 year Prostate Cancer Screening: doing today Bone Density Testing: long standing exposure to systemic steroids due to RA HIV Screening: doing today Seasonal Influenza Vaccination: had this season, thru rheum Td/Tdap Vaccination: 2010 Pneumococcal Vaccination: had thru rheum Zoster Vaccination: 2015 Frequency of Dental evaluation: Q6 months Frequency of Eye evaluation: yearly  Overall patient is doing well Off opiates, doing well Uses mostly xanax at bedtime to help sleep thru the pain as it is worse then Using diclofenac gel for hands Uses tramadol or ibuprofen sparingly pmp reviewed Anxiety well controlled on sertraline Seeing GI for gerd, recently had ph manometry Nocturia and ED doing well with cialis  Fall Risk  04/11/2018 10/17/2017 07/17/2017 04/04/2017 03/22/2017  Falls in the past year? 0 No No No No  Injury with Fall? 0 - - - -     Depression screen [facility] 2/9 04/11/2018 10/17/2017 07/17/2017  Decreased Interest 0 0 0  Down, Depressed, Hopeless 0 0 0  PHQ - 2 Score 0 0 0  Altered sleeping 0 - -  Tired, decreased energy 0 - -  Change in appetite 0 - -  Feeling bad or failure about yourself  0 - -  Trouble concentrating 0 - -  Moving slowly or fidgety/restless 0 - -  Suicidal thoughts 0 - -  PHQ-9 Score 0 - -  Difficult doing work/chores Not difficult at all - -    No Known Allergies  Prior to Admission medications   Medication Sig Start Date End Date Taking? Authorizing Provider  ALPRAZolam [name]) 0.5 MG tablet Take 1 tablet (0.5 mg total) by mouth every 6 (six) hours as needed for  anxiety (anxiety). 10/17/17  Yes Rutherford Guys, MD  b complex vitamins capsule Take 1 capsule by mouth daily.   Yes [provider]  CALCIUM-MAGNESIUM-ZINC PO Take 1 tablet by mouth daily.   Yes [provider]  Cholecalciferol (VITAMIN D3) 2000 UNITS capsule Take 2,000 Units by mouth daily.   Yes [provider]  Diclofenac Sodium 1 % CREA Place onto the skin as needed.   Yes [provider]  erythromycin ophthalmic ointment Place 1 application into the left eye at bedtime. Patient taking differently: Place 1 application into the left eye as needed.  07/17/17  Yes Rutherford Guys, MD  etanercept (ENBREL SURECLICK) 50 MG/ML injection Inject 0.98 mLs (50 mg total) into the skin once a week. Inject 1 syringe SQ once a week 10/25/17  Yes Jegede, Olugbemiga E, MD  folic acid (FOLVITE) 1 MG tablet Take 1 mg by mouth daily.   Yes [provider]  Melatonin 3 MG TBDP Take 3 mg by mouth at bedtime.   Yes [provider]  METHOTREXATE SODIUM IJ Inject 60 mg into the muscle once a week.    Yes [provider]  omeprazole (PRILOSEC) 20 MG capsule TAKE 1 CAPSULE BY MOUTH 2 TIMES DAILY BEFORE A MEAL. 02/17/18  Yes Rutherford Guys, MD  sertraline (ZOLOFT) 100 MG tablet Take 1 tablet (100 mg total) by mouth daily. 10/17/17  Yes Pamella Pert,  Lilia Argue, MD  tadalafil (CIALIS) 5 MG tablet TAKE 1 TABLET BY MOUTH AT BEDTIME. 02/21/18  Yes Rutherford Guys, MD  VITAMIN E PO Take 1 tablet by mouth daily.   Yes [provider]  albuterol (PROVENTIL HFA;VENTOLIN HFA) 108 (90 Base) MCG/ACT inhaler Inhale 2 puffs into the lungs every 6 (six) hours as needed for wheezing or shortness of breath. Patient not taking: Reported on 01/01/2018 03/22/17   Rutherford Guys, MD  buprenorphine Haze Rushing) 10 MCG/HR PTWK patch Place 15 mcg onto the skin once a week. Sunday    [provider]  loratadine (CLARITIN) 10 MG tablet Take 10 mg by mouth daily.    [provider]    Past Medical History:  Diagnosis Date   Allergy    Anxiety    Colon polyps    Enlarged prostate    Esophageal stricture    GERD (gastroesophageal reflux disease)    Hyperlipidemia    caused by a medication he was taking - labs after med d/c'd were normal   Legally blind in left eye, as defined in [place]    at age 71, from infection, has left eye prosthesis   Osteoarthritis    Pneumonia    Rheumatoid arthritis ([place])    Dr Lincoln Maxin, [facility]    Past Surgical History:  Procedure Laterality Date   COLONOSCOPY  2014   dental implants     ESOPHAGEAL MANOMETRY N/A 03/10/2018   Procedure: ESOPHAGEAL MANOMETRY (EM);  Surgeon: Mauri Pole, MD;  Location: [facility];  Service: Endoscopy;  Laterality: N/A;   ESOPHAGOGASTRODUODENOSCOPY     around 2010   EYE SURGERY     parasitic infection left eye   FRACTURE SURGERY     [place] IMPEDANCE STUDY N/A 03/10/2018   Procedure: [place] IMPEDANCE STUDY;  Surgeon: Mauri Pole, MD;  Location: [facility];  Service: Endoscopy;  Laterality: N/A;   SHOULDER ARTHROSCOPY WITH SUBACROMIAL DECOMPRESSION AND OPEN ROTATOR C Left 11/09/2013   Procedure: LEFT SHOULDER ARTHROSCOPY WITH SUBACROMIAL DECOMPRESSION AND OPEN BICEP TENODESIS, OPEN DISTAL CLAVICLE RESECTION ,open rotator cuff repair ,Debridement slap tear ;  Surgeon: Augustin Schooling, MD;  Location: [facility];  Service: Orthopedics;  Laterality: Left;   TONSILLECTOMY      Social History   Tobacco Use   Smoking status: Former Smoker   Smokeless tobacco: Never Used   Tobacco comment: quit 30 years ago   Substance Use Topics   Alcohol use: Yes    Comment: social drinker    Family History  Problem Relation Age of Onset   Breast cancer Mother    Colon cancer Neg Hx    Esophageal cancer Neg Hx    Rectal cancer Neg Hx    Stomach cancer Neg Hx     Review of Systems  Constitutional: Negative for chills and fever.  Respiratory: Negative for  cough and shortness of breath.   Cardiovascular: Negative for chest pain, palpitations and leg swelling.  Gastrointestinal: Negative for abdominal pain, nausea and vomiting.  All other systems reviewed and are negative. per hpi   OBJECTIVE:  Today's Vitals   04/11/18 1011  BP: 122/72  Pulse: 75  Resp: 17  Temp: 97.8 F (36.6 C)  TempSrc: Oral  SpO2: 98%  Weight: 189 lb (85.7 kg)  Height: 5' 8"  (1.727 m)   Body mass index is 28.74 kg/m.  Wt Readings from Last 3 Encounters:  04/11/18 189 lb (85.7 kg)  02/06/18 191 lb (86.6  kg)  01/01/18 191 lb 8 oz (86.9 kg)    Physical Exam Vitals signs and nursing note reviewed.  Constitutional:      Appearance: He is well-developed.  HENT:     Head: Normocephalic and atraumatic.     Right Ear: Hearing, tympanic membrane, ear canal and external ear normal.     Left Ear: Hearing, tympanic membrane, ear canal and external ear normal.     Mouth/Throat:     Pharynx: No oropharyngeal exudate.  Eyes:     Conjunctiva/sclera: Conjunctivae normal.     Pupils: Pupils are equal, round, and reactive to light.  Neck:     Musculoskeletal: Neck supple.     Thyroid: No thyromegaly.  Cardiovascular:     Rate and Rhythm: Normal rate and regular rhythm.     Heart sounds: Normal heart sounds. No murmur. No friction rub. No gallop.   Pulmonary:     Effort: Pulmonary effort is normal.     Breath sounds: Normal breath sounds. No wheezing, rhonchi or rales.  Abdominal:     General: Bowel sounds are normal. There is no distension.     Palpations: Abdomen is soft. There is no mass.     Tenderness: There is no abdominal tenderness.  Musculoskeletal: Normal range of motion.     Right lower leg: No edema.     Left lower leg: No edema.  Lymphadenopathy:     Cervical: No cervical adenopathy.  Skin:    General: Skin is warm and dry.  Neurological:     Mental Status: He is alert and oriented to person, place, and time.     Cranial Nerves: No cranial  nerve deficit.     Coordination: Coordination normal.     Gait: Gait normal.     Deep Tendon Reflexes: Reflexes are normal and symmetric.  Psychiatric:        Mood and Affect: Mood normal.    ASSESSMENT and PLAN  1. Annual physical exam Routine HCM labs ordered. HCM reviewed/discussed. Anticipatory guidance regarding healthy weight, lifestyle and choices given.   2. Need for hepatitis C screening test - Hepatitis C antibody  3. Screening for HIV (human immunodeficiency virus) - HIV Antibody (routine testing w rflx)  4. Hyperlipidemia, unspecified hyperlipidemia type - Lipid panel - TSH - CMP14+EGFR  5. Benign prostatic hyperplasia, unspecified whether lower urinary tract symptoms present - PSA  6. Hx of colonic polyps  7. Generalized anxiety disorder  8. Encounter for long term benzodiazepine therapy  9. Rheumatoid arthritis, involving unspecified site, unspecified rheumatoid factor presence ([place]) - DG Bone Density; Future  10. Long term (current) use of systemic steroids - DG Bone Density; Future  Other orders - ALPRAZolam (XANAX) 0.5 MG tablet; Take 1 tablet (0.5 mg total) by mouth every 6 (six) hours as needed for anxiety (anxiety). - erythromycin ophthalmic ointment; Place 1 application into the left eye at bedtime. - omeprazole (PRILOSEC) 20 MG capsule; TAKE 1 CAPSULE BY MOUTH 2 TIMES DAILY BEFORE A MEAL. - sertraline (ZOLOFT) 100 MG tablet; Take 1 tablet (100 mg total) by mouth daily. - tadalafil (CIALIS) 5 MG tablet; Take 1 tablet (5 mg total) by mouth at bedtime.   Return in about 6 months (around 10/12/2018) for routine .    Rutherford Guys, MD Primary Care at [facility] [place], [place] [address] Ph.  [phone] Fax [phone]

## 2018-04-12 LAB — CMP14+EGFR
ALT: 19 IU/L (ref 0–44)
AST: 24 IU/L (ref 0–40)
Albumin/Globulin Ratio: 1.8 (ref 1.2–2.2)
Albumin: 4.6 g/dL (ref 3.8–4.8)
Alkaline Phosphatase: 52 IU/L (ref 39–117)
BUN/Creatinine Ratio: 16 (ref 10–24)
BUN: 14 mg/dL (ref 8–27)
Bilirubin Total: 0.3 mg/dL (ref 0.0–1.2)
CO2: 18 mmol/L — ABNORMAL LOW (ref 20–29)
Calcium: 9.5 mg/dL (ref 8.6–10.2)
Chloride: 100 mmol/L (ref 96–106)
Creatinine, Ser: 0.86 mg/dL (ref 0.76–1.27)
GFR calc Af Amer: 106 mL/min/{1.73_m2} (ref >59)
GFR calc non Af Amer: 92 mL/min/{1.73_m2} (ref >59)
Globulin, Total: 2.6 g/dL (ref 1.5–4.5)
Glucose: 89 mg/dL (ref 65–99)
Potassium: 4.5 mmol/L (ref 3.5–5.2)
Sodium: 141 mmol/L (ref 134–144)
Total Protein: 7.2 g/dL (ref 6.0–8.5)

## 2018-04-12 LAB — TSH: TSH: 1.16 u[IU]/mL (ref 0.450–4.500)

## 2018-04-12 LAB — LIPID PANEL
Chol/HDL Ratio: 6 ratio — ABNORMAL HIGH (ref 0.0–5.0)
Cholesterol, Total: 240 mg/dL — ABNORMAL HIGH (ref 100–199)
HDL: 40 mg/dL (ref >39)
LDL Calculated: 172 mg/dL — ABNORMAL HIGH (ref 0–99)
Triglycerides: 139 mg/dL (ref 0–149)
VLDL Cholesterol Cal: 28 mg/dL (ref 5–40)

## 2018-04-12 LAB — HIV ANTIBODY (ROUTINE TESTING W REFLEX): HIV Screen 4th Generation wRfx: NONREACTIVE

## 2018-04-12 LAB — HEPATITIS C ANTIBODY: Hep C Virus Ab: <0.1 s/co ratio (ref 0.0–0.9)

## 2018-04-12 LAB — PSA: Prostate Specific Ag, Serum: 3.2 ng/mL (ref 0.0–4.0)

## 2018-04-24 ENCOUNTER — Encounter: Payer: Self-pay | Admitting: Family Medicine

## 2018-04-24 ENCOUNTER — Other Ambulatory Visit: Payer: Self-pay

## 2018-04-24 ENCOUNTER — Telehealth (INDEPENDENT_AMBULATORY_CARE_PROVIDER_SITE_OTHER): Payer: Medicare Other | Admitting: Family Medicine

## 2018-04-24 DIAGNOSIS — N63 Unspecified lump in unspecified breast: Secondary | ICD-10-CM | POA: Diagnosis not present

## 2018-04-24 DIAGNOSIS — R590 Localized enlarged lymph nodes: Secondary | ICD-10-CM

## 2018-04-24 NOTE — Progress Notes (Signed)
Pt c/o 2 lumps under right nipple, whole breast is swollen notice about Sunday night. Says it is a little sore. No travel.

## 2018-04-24 NOTE — Progress Notes (Signed)
Virtual Visit via telephone Note  I connected with@ on 04/24/18 at 530 by telephone and verified that I am speaking with the correct person using two identifiers. Grant Hunter is currently located at home and wife is currently with him during visit. The provider, Rutherford Guys, MD is located in their office at time of visit.  I discussed the limitations, risks, security and privacy concerns of performing an evaluation and management service by telephone and the availability of in person appointments. I also discussed with the patient that there may be a patient responsible charge related to this service. The patient expressed understanding and agreed to proceed.   Telephone visit today for right breast lumps  HPI ? Patient's wife noticed swelling of right breast 4 days ago Feels that he has two small peas, mobile and firm,  just under right nipple, tender to palpation No skin changes, not red, not warm, no nipple discharge Wife reports some fullness that is tender along pectoral side of of axilla, but no fullness or tenderness in axilla Not better or worse with movement Mother had breast cancer  Fall Risk  04/24/2018 04/11/2018 10/17/2017 07/17/2017 04/04/2017  Falls in the past year? 0 0 No No No  Injury with Fall? - 0 - - -     Depression screen [facility] 2/9 04/24/2018 04/11/2018 10/17/2017  Decreased Interest 0 0 0  Down, Depressed, Hopeless 0 0 0  PHQ - 2 Score 0 0 0  Altered sleeping - 0 -  Tired, decreased energy - 0 -  Change in appetite - 0 -  Feeling bad or failure about yourself  - 0 -  Trouble concentrating - 0 -  Moving slowly or fidgety/restless - 0 -  Suicidal thoughts - 0 -  PHQ-9 Score - 0 -  Difficult doing work/chores - Not difficult at all -    No Known Allergies  Prior to Admission medications   Medication Sig Start Date End Date Taking? Authorizing Provider  ALPRAZolam [name]) 0.5 MG tablet Take 1 tablet (0.5 mg total) by mouth every 6 (six) hours as needed for  anxiety (anxiety). 04/11/18  Yes Rutherford Guys, MD  b complex vitamins capsule Take 1 capsule by mouth daily.   Yes [provider]  CALCIUM-MAGNESIUM-ZINC PO Take 1 tablet by mouth daily.   Yes [provider]  Cholecalciferol (VITAMIN D3) 2000 UNITS capsule Take 2,000 Units by mouth daily.   Yes [provider]  Diclofenac Sodium 1 % CREA Place onto the skin as needed.   Yes [provider]  erythromycin ophthalmic ointment Place 1 application into the left eye at bedtime. 04/11/18  Yes Rutherford Guys, MD  etanercept (ENBREL SURECLICK) 50 MG/ML injection Inject 0.98 mLs (50 mg total) into the skin once a week. Inject 1 syringe SQ once a week 10/25/17  Yes Jegede, Olugbemiga E, MD  folic acid (FOLVITE) 1 MG tablet Take 1 mg by mouth daily.   Yes [provider]  Melatonin 3 MG TBDP Take 3 mg by mouth at bedtime.   Yes [provider]  METHOTREXATE SODIUM IJ Inject 60 mg into the muscle once a week.    Yes [provider]  omeprazole (PRILOSEC) 20 MG capsule TAKE 1 CAPSULE BY MOUTH 2 TIMES DAILY BEFORE A MEAL. 04/11/18  Yes Rutherford Guys, MD  sertraline (ZOLOFT) 100 MG tablet Take 1 tablet (100 mg total) by mouth daily. 04/11/18  Yes Rutherford Guys, MD  tadalafil (  CIALIS) 5 MG tablet Take 1 tablet (5 mg total) by mouth at bedtime. 04/11/18  Yes Rutherford Guys, MD  VITAMIN E PO Take 1 tablet by mouth daily.   Yes [provider]  albuterol (PROVENTIL HFA;VENTOLIN HFA) 108 (90 Base) MCG/ACT inhaler Inhale 2 puffs into the lungs every 6 (six) hours as needed for wheezing or shortness of breath. Patient not taking: Reported on 01/01/2018 03/22/17   Rutherford Guys, MD    Past Medical History:  Diagnosis Date  . Allergy   . Anxiety   . Colon polyps   . Enlarged prostate   . Esophageal stricture   . GERD (gastroesophageal reflux disease)   . Hyperlipidemia    caused by a medication he was taking - labs after med d/c'd  were normal  . Legally blind in left eye, as defined in [place]    at age 24, from infection, has left eye prosthesis  . Osteoarthritis   . Pneumonia   . Rheumatoid arthritis ([place])    Dr Lincoln Maxin, [facility]    Past Surgical History:  Procedure Laterality Date  . COLONOSCOPY  2014  . dental implants    . ESOPHAGEAL MANOMETRY N/A 03/10/2018   Procedure: ESOPHAGEAL MANOMETRY (EM);  Surgeon: Mauri Pole, MD;  [facility];  Service: Endoscopy;  Laterality: N/A;  . ESOPHAGOGASTRODUODENOSCOPY     around 2010  . EYE SURGERY     parasitic infection left eye  . FRACTURE SURGERY    . [place] IMPEDANCE STUDY N/A 03/10/2018   Procedure: [place] IMPEDANCE STUDY;  Surgeon: Mauri Pole, MD;  [facility];  Service: Endoscopy;  Laterality: N/A;  . SHOULDER ARTHROSCOPY WITH SUBACROMIAL DECOMPRESSION AND OPEN ROTATOR C Left 11/09/2013   Procedure: LEFT SHOULDER ARTHROSCOPY WITH SUBACROMIAL DECOMPRESSION AND OPEN BICEP TENODESIS, OPEN DISTAL CLAVICLE RESECTION ,open rotator cuff repair ,Debridement slap tear ;  Surgeon: Augustin Schooling, MD;  Location: [facility];  Service: Orthopedics;  Laterality: Left;  . TONSILLECTOMY      Social History   Tobacco Use  . Smoking status: Former Research scientist (life sciences)  . Smokeless tobacco: Never Used  . Tobacco comment: quit 30 years ago   Substance Use Topics  . Alcohol use: Yes    Comment: social drinker    Family History  Problem Relation Age of Onset  . Breast cancer Mother   . Colon cancer Neg Hx   . Esophageal cancer Neg Hx   . Rectal cancer Neg Hx   . Stomach cancer Neg Hx     ROS Per hpi Objective  Vitals as reported by the patient: none  There were no vitals filed for this visit.  ASSESSMENT and PLAN  1. Breast mass in male - MM DIAG BREAST TOMO UNI RIGHT; Future - US BREAST LTD UNI RIGHT INC AXILLA; Future  2. Lymphadenopathy, axillary - MM DIAG BREAST TOMO UNI RIGHT; Future - US BREAST LTD UNI RIGHT INC AXILLA; Future   FOLLOW-UP: after studies   The above assessment and management plan was discussed with the patient. The patient verbalized understanding of and has agreed to the management plan. Patient is aware to call the clinic if symptoms persist or worsen. Patient is aware when to return to the clinic for a follow-up visit. Patient educated on when it is appropriate to go to the emergency department.    I provided 18 minutes of non-face-to-face time during this encounter.  Rutherford Guys, MD Primary Care at [facility]  [place], [place] [address] Ph.  [phone] Fax [phone]

## 2018-04-28 ENCOUNTER — Telehealth: Payer: Self-pay | Admitting: Registered Nurse

## 2018-04-28 MED FILL — ALPRAZolam 0.5 MG TABS: 0.5 | 30 days supply | Qty: 120 | Fill #0

## 2018-04-28 NOTE — Telephone Encounter (Signed)
Called pt to schedule awv, pt wife informed me that pt has since changed insurance and may not need awv at this time. Pt did have routine annual physical exam at [facility] on 04/11/18 with Dr Pamella Pert.

## 2018-05-12 ENCOUNTER — Other Ambulatory Visit: Payer: Self-pay

## 2018-05-12 ENCOUNTER — Ambulatory Visit
Admission: RE | Admit: 2018-05-12 | Discharge: 2018-05-12 | Disposition: A | Discharge status: Home / Self Care | Payer: Medicare Other | Source: Ambulatory Visit | Attending: Family Medicine | Admitting: Family Medicine

## 2018-05-12 DIAGNOSIS — N6313 Unspecified lump in the right breast, lower outer quadrant: Secondary | ICD-10-CM | POA: Diagnosis not present

## 2018-05-12 DIAGNOSIS — N63 Unspecified lump in unspecified breast: Secondary | ICD-10-CM

## 2018-05-12 DIAGNOSIS — R590 Localized enlarged lymph nodes: Secondary | ICD-10-CM

## 2018-05-27 DIAGNOSIS — Z79899 Other long term (current) drug therapy: Secondary | ICD-10-CM | POA: Diagnosis not present

## 2018-05-27 DIAGNOSIS — M25511 Pain in right shoulder: Secondary | ICD-10-CM | POA: Diagnosis not present

## 2018-05-27 DIAGNOSIS — M0579 Rheumatoid arthritis with rheumatoid factor of multiple sites without organ or systems involvement: Secondary | ICD-10-CM | POA: Diagnosis not present

## 2018-05-27 DIAGNOSIS — M1711 Unilateral primary osteoarthritis, right knee: Secondary | ICD-10-CM | POA: Diagnosis not present

## 2018-05-27 DIAGNOSIS — M150 Primary generalized (osteo)arthritis: Secondary | ICD-10-CM | POA: Diagnosis not present

## 2018-05-27 MED FILL — DICLOFENAC SODIUM 1 % GEL: 1 | 30 days supply | Qty: 200 | Fill #0

## 2018-05-27 MED FILL — METHOTREXATE 25 MG/ML VIAL: 50 | 84 days supply | Qty: 8 | Fill #0

## 2018-05-29 DIAGNOSIS — M0579 Rheumatoid arthritis with rheumatoid factor of multiple sites without organ or systems involvement: Secondary | ICD-10-CM | POA: Diagnosis not present

## 2018-05-29 MED FILL — ALPRAZolam 0.5 MG TABS: 0.5 | 30 days supply | Qty: 120 | Fill #1

## 2018-06-24 ENCOUNTER — Other Ambulatory Visit: Payer: Self-pay

## 2018-06-24 ENCOUNTER — Ambulatory Visit
Admission: RE | Admit: 2018-06-24 | Discharge: 2018-06-24 | Disposition: A | Discharge status: Home / Self Care | Payer: Medicare Other | Source: Ambulatory Visit | Attending: Family Medicine | Admitting: Family Medicine

## 2018-06-24 DIAGNOSIS — Z7952 Long term (current) use of systemic steroids: Secondary | ICD-10-CM

## 2018-06-24 DIAGNOSIS — M069 Rheumatoid arthritis, unspecified: Secondary | ICD-10-CM

## 2018-06-24 DIAGNOSIS — M8588 Other specified disorders of bone density and structure, other site: Secondary | ICD-10-CM | POA: Diagnosis not present

## 2018-06-26 MED FILL — ALPRAZolam 0.5 MG TABS: 0.5 | 30 days supply | Qty: 120 | Fill #2

## 2018-06-26 MED FILL — DICLOFENAC SODIUM 1 % GEL: 1 | 30 days supply | Qty: 200 | Fill #1

## 2018-07-28 MED FILL — SERTRALINE HCL 100 MG TABS: 100 | 90 days supply | Qty: 90 | Fill #1

## 2018-07-28 MED FILL — ALPRAZolam 0.5 MG TABS: 0.5 | 30 days supply | Qty: 120 | Fill #3

## 2018-07-28 MED FILL — OMEPRAZOLE 20 MG CPDR: 20 | 90 days supply | Qty: 180 | Fill #1

## 2018-07-28 MED FILL — DICLOFENAC SODIUM 1 % GEL: 1 | 30 days supply | Qty: 200 | Fill #2

## 2018-08-18 MED FILL — TADALAFIL 5 MG TABS: 5 | 90 days supply | Qty: 90 | Fill #1

## 2018-08-25 MED FILL — ALPRAZolam 0.5 MG TABS: 0.5 | 30 days supply | Qty: 120 | Fill #4

## 2018-08-27 DIAGNOSIS — M150 Primary generalized (osteo)arthritis: Secondary | ICD-10-CM | POA: Diagnosis not present

## 2018-08-27 DIAGNOSIS — M25511 Pain in right shoulder: Secondary | ICD-10-CM | POA: Diagnosis not present

## 2018-08-27 DIAGNOSIS — M1711 Unilateral primary osteoarthritis, right knee: Secondary | ICD-10-CM | POA: Diagnosis not present

## 2018-08-27 DIAGNOSIS — Z79899 Other long term (current) drug therapy: Secondary | ICD-10-CM | POA: Diagnosis not present

## 2018-08-27 DIAGNOSIS — M0579 Rheumatoid arthritis with rheumatoid factor of multiple sites without organ or systems involvement: Secondary | ICD-10-CM | POA: Diagnosis not present

## 2018-09-02 MED FILL — DICLOFENAC SODIUM 1 % GEL: 1 | 30 days supply | Qty: 200 | Fill #3

## 2018-09-02 MED FILL — METHOTREXATE 25 MG/ML VIAL: 50 | 84 days supply | Qty: 8 | Fill #1

## 2018-10-02 ENCOUNTER — Other Ambulatory Visit: Payer: Self-pay | Admitting: Family Medicine

## 2018-10-02 MED FILL — ALPRAZolam 0.5 MG TABS: 0.5 | 30 days supply | Qty: 120 | Fill #5

## 2018-10-02 MED FILL — TADALAFIL 5 MG TABS: 5 | 90 days supply | Qty: 90 | Fill #0

## 2018-10-02 MED FILL — DICLOFENAC SODIUM 1 % GEL: 1 | 10 days supply | Qty: 200 | Fill #4

## 2018-10-13 ENCOUNTER — Ambulatory Visit (INDEPENDENT_AMBULATORY_CARE_PROVIDER_SITE_OTHER): Payer: Medicare Other | Admitting: Family Medicine

## 2018-10-13 ENCOUNTER — Encounter: Payer: Self-pay | Admitting: Family Medicine

## 2018-10-13 ENCOUNTER — Other Ambulatory Visit: Payer: Self-pay

## 2018-10-13 VITALS — BP 130/78 | HR 74 | Temp 97.8°F | Ht 68.0 in | Wt 192.0 lb

## 2018-10-13 DIAGNOSIS — F411 Generalized anxiety disorder: Secondary | ICD-10-CM

## 2018-10-13 DIAGNOSIS — E785 Hyperlipidemia, unspecified: Secondary | ICD-10-CM

## 2018-10-13 DIAGNOSIS — M8580 Other specified disorders of bone density and structure, unspecified site: Secondary | ICD-10-CM | POA: Diagnosis not present

## 2018-10-13 DIAGNOSIS — N400 Enlarged prostate without lower urinary tract symptoms: Secondary | ICD-10-CM | POA: Diagnosis not present

## 2018-10-13 DIAGNOSIS — Z23 Encounter for immunization: Secondary | ICD-10-CM

## 2018-10-13 NOTE — Patient Instructions (Addendum)
If you have lab work done today you will be contacted with your lab results within the next 2 weeks.  If you have not heard from [place] then please contact us. The fastest way to get your results is to register for My Chart.   IF you received an x-ray today, you will receive an invoice from [facility]. Please contact [facility] at [phone] with questions or concerns regarding your invoice.   IF you received labwork today, you will receive an invoice from [facility]. Please contact [facility] at [phone] with questions or concerns regarding your invoice.   Our billing staff will not be able to assist you with questions regarding bills from these companies.  You will be contacted with the lab results as soon as they are available. The fastest way to get your results is to activate your My Chart account. Instructions are located on the last page of this paperwork. If you have not heard from [place] regarding the results in 2 weeks, please contact this office.      Osteopenia  Osteopenia is a loss of thickness (density) inside of the bones. Another name for osteopenia is low bone mass. Mild osteopenia is a normal part of aging. It is not a disease, and it does not cause symptoms. However, if you have osteopenia and continue to lose bone mass, you could develop a condition that causes the bones to become thin and break more easily (osteoporosis). You may also lose some height, have back pain, and have a stooped posture. Although osteopenia is not a disease, making changes to your lifestyle and diet can help to prevent osteopenia from developing into osteoporosis. What are the causes? Osteopenia is caused by loss of calcium in the bones.  Bones are constantly changing. Old bone cells are continually being replaced with new bone cells. This process builds new bone. The mineral calcium is needed to build new bone and maintain bone density. Bone density is usually highest around age  61. After that, most people's bodies cannot replace all the bone they have lost with new bone. What increases the risk? You are more likely to develop this condition if:  You are older than age 57.  You are a woman who went through menopause early.  You have a long illness that keeps you in bed.  You do not get enough exercise.  You lack certain nutrients (malnutrition).  You have an overactive thyroid gland (hyperthyroidism).  You smoke.  You drink a lot of alcohol.  You are taking medicines that weaken the bones, such as steroids. What are the signs or symptoms? This condition does not cause any symptoms. You may have a slightly higher risk for bone breaks (fractures), so getting fractures more easily than normal may be an indication of osteopenia. How is this diagnosed? Your health care provider can diagnose this condition with a special type of X-ray exam that measures bone density (dual-energy X-ray absorptiometry, DEXA). This test can measure bone density in your hips, spine, and wrists. Osteopenia has no symptoms, so this condition is usually diagnosed after a routine bone density screening test is done for osteoporosis. This routine screening is usually done for:  Women who are age 23 or older.  Men who are age 69 or older. If you have risk factors for osteopenia, you may have the screening test at an earlier age. How is this treated? Making dietary and lifestyle changes can lower your risk for osteoporosis. If you have severe  osteopenia that is close to becoming osteoporosis, your health care provider may prescribe medicines and dietary supplements such as calcium and vitamin D. These supplements help to rebuild bone density. Follow these instructions at home:   Take over-the-counter and prescription medicines only as told by your health care provider. These include vitamins and supplements.  Eat a diet that is high in calcium and vitamin D. ? Calcium is found in dairy  products, beans, salmon, and leafy green vegetables like spinach and broccoli. ? Look for foods that have vitamin D and calcium added to them (fortified foods), such as orange juice, cereal, and bread.  Do 30 or more minutes of a weight-bearing exercise every day, such as walking, jogging, or playing a sport. These types of exercises strengthen the bones.  Take precautions at home to lower your risk of falling, such as: ? Keeping rooms well-lit and free of clutter, such as cords. ? Installing safety rails on stairs. ? Using rubber mats in the bathroom or other areas that are often wet or slippery.  Do not use any products that contain nicotine or tobacco, such as cigarettes and e-cigarettes. If you need help quitting, ask your health care provider.  Avoid alcohol or limit alcohol intake to no more than 1 drink a day for nonpregnant women and 2 drinks a day for men. One drink equals 12 oz of beer, 5 oz of wine, or 1 oz of hard liquor.  Keep all follow-up visits as told by your health care provider. This is important. Contact a health care provider if:  You have not had a bone density screening for osteoporosis and you are: ? A woman, age 78 or older. ? A man, age 62 or older.  You are a postmenopausal woman who has not had a bone density screening for osteoporosis.  You are older than age 57 and you want to know if you should have bone density screening for osteoporosis. Summary  Osteopenia is a loss of thickness (density) inside of the bones. Another name for osteopenia is low bone mass.  Osteopenia is not a disease, but it may increase your risk for a condition that causes the bones to become thin and break more easily (osteoporosis).  You may be at risk for osteopenia if you are older than age 76 or if you are a woman who went through early menopause.  Osteopenia does not cause any symptoms, but it can be diagnosed with a bone density screening test.  Dietary and lifestyle  changes are the first treatment for osteopenia. These may lower your risk for osteoporosis. This information is not intended to replace advice given to you by your health care provider. Make sure you discuss any questions you have with your health care provider. Document Released: 10/17/2016 Document Revised: 12/21/2016 Document Reviewed: 10/17/2016 Elsevier Patient Education  2020 [employer].

## 2018-10-13 NOTE — Progress Notes (Signed)
9/21/202010:05 AM  Grant Hunter 1955/06/18, 63 y.o., male [id number]  Chief Complaint  Patient presents with  . choronic Condtions    F/u 6 m     HPI:   Patient is a 63 y.o. male with past medical history significant for RA, BPH, chronic pain, GAD, GERD, L eye prostethic, osteopenia who presents today for routine followup  Last OV march 2020 dexa osteopenia, takes vitamin D supplement daily Otherwise he is doing well Denies any acute concerns Reports chronic conditions are well controlled on current medications Denies any side effects He has been working extensively on his yard Reports balanced healthy diet, no sign red meat Only time he had elevated HLP was due to RA med   Lab Results  Component Value Date   CHOL 240 (H) 04/11/2018   HDL 40 04/11/2018   LDLCALC 172 (H) 04/11/2018   TRIG 139 04/11/2018   CHOLHDL 6.0 (H) 04/11/2018    Depression screen [facility] 2/9 04/24/2018 04/11/2018 10/17/2017  Decreased Interest 0 0 0  Down, Depressed, Hopeless 0 0 0  PHQ - 2 Score 0 0 0  Altered sleeping - 0 -  Tired, decreased energy - 0 -  Change in appetite - 0 -  Feeling bad or failure about yourself  - 0 -  Trouble concentrating - 0 -  Moving slowly or fidgety/restless - 0 -  Suicidal thoughts - 0 -  PHQ-9 Score - 0 -  Difficult doing work/chores - Not difficult at all -    Fall Risk  10/13/2018 04/24/2018 04/11/2018 10/17/2017 07/17/2017  Falls in the past year? 0 0 0 No No  Number falls in past yr: 0 - - - -  Injury with Fall? 0 - 0 - -  Follow up Falls evaluation completed - - - -     No Known Allergies  Prior to Admission medications   Medication Sig Start Date End Date Taking? Authorizing Provider  ALPRAZolam [name]) 0.5 MG tablet Take 1 tablet (0.5 mg total) by mouth every 6 (six) hours as needed for anxiety (anxiety). 04/11/18  Yes Rutherford Guys, MD  b complex vitamins capsule Take 1 capsule by mouth daily.   Yes [provider]  CALCIUM-MAGNESIUM-ZINC PO  Take 1 tablet by mouth daily.   Yes [provider]  Cholecalciferol (VITAMIN D3) 2000 UNITS capsule Take 2,000 Units by mouth daily.   Yes [provider]  Diclofenac Sodium 1 % CREA Place onto the skin as needed.   Yes [provider]  erythromycin ophthalmic ointment Place 1 application into the left eye at bedtime. 04/11/18  Yes Rutherford Guys, MD  etanercept (ENBREL SURECLICK) 50 MG/ML injection Inject 0.98 mLs (50 mg total) into the skin once a week. Inject 1 syringe SQ once a week 10/25/17  Yes Jegede, Olugbemiga E, MD  folic acid (FOLVITE) 1 MG tablet Take 1 mg by mouth daily.   Yes [provider]  Melatonin 3 MG TBDP Take 3 mg by mouth at bedtime.   Yes [provider]  METHOTREXATE SODIUM IJ Inject 60 mg into the muscle once a week.    Yes [provider]  omeprazole (PRILOSEC) 20 MG capsule TAKE 1 CAPSULE BY MOUTH 2 TIMES DAILY BEFORE A MEAL. 04/11/18  Yes Rutherford Guys, MD  sertraline (ZOLOFT) 100 MG tablet TAKE 1 TABLET BY MOUTH ONCE DAILY 10/02/18  Yes Rutherford Guys, MD  tadalafil (CIALIS) 5 MG tablet Take 1 tablet (5 mg total)  by mouth at bedtime. 04/11/18  Yes Rutherford Guys, MD  VITAMIN E PO Take 1 tablet by mouth daily.   Yes [provider]  albuterol (PROVENTIL HFA;VENTOLIN HFA) 108 (90 Base) MCG/ACT inhaler Inhale 2 puffs into the lungs every 6 (six) hours as needed for wheezing or shortness of breath. Patient not taking: Reported on 01/01/2018 03/22/17   Rutherford Guys, MD    Past Medical History:  Diagnosis Date  . Allergy   . Anxiety   . Colon polyps   . Enlarged prostate   . Esophageal stricture   . GERD (gastroesophageal reflux disease)   . Hyperlipidemia    caused by a medication he was taking - labs after med d/c'd were normal  . Legally blind in left eye, as defined in [place]    at age 68, from infection, has left eye prosthesis  . Osteoarthritis   . Pneumonia   . Rheumatoid arthritis ([place])     Dr Lincoln Maxin, [facility]    Past Surgical History:  Procedure Laterality Date  . COLONOSCOPY  2014  . dental implants    . ESOPHAGEAL MANOMETRY N/A 03/10/2018   Procedure: ESOPHAGEAL MANOMETRY (EM);  Surgeon: Mauri Pole, MD;  Location: [facility];  Service: Endoscopy;  Laterality: N/A;  . ESOPHAGOGASTRODUODENOSCOPY     around 2010  . EYE SURGERY     parasitic infection left eye  . FRACTURE SURGERY    . [place] IMPEDANCE STUDY N/A 03/10/2018   Procedure: [place] IMPEDANCE STUDY;  Surgeon: Mauri Pole, MD;  Location: [facility];  Service: Endoscopy;  Laterality: N/A;  . SHOULDER ARTHROSCOPY WITH SUBACROMIAL DECOMPRESSION AND OPEN ROTATOR C Left 11/09/2013   Procedure: LEFT SHOULDER ARTHROSCOPY WITH SUBACROMIAL DECOMPRESSION AND OPEN BICEP TENODESIS, OPEN DISTAL CLAVICLE RESECTION ,open rotator cuff repair ,Debridement slap tear ;  Surgeon: Augustin Schooling, MD;  Location: [facility];  Service: Orthopedics;  Laterality: Left;  . TONSILLECTOMY      Social History   Tobacco Use  . Smoking status: Former Research scientist (life sciences)  . Smokeless tobacco: Never Used  . Tobacco comment: quit 30 years ago   Substance Use Topics  . Alcohol use: Yes    Comment: social drinker    Family History  Problem Relation Age of Onset  . Breast cancer Mother 89  . Colon cancer Neg Hx   . Esophageal cancer Neg Hx   . Rectal cancer Neg Hx   . Stomach cancer Neg Hx     Review of Systems  Constitutional: Negative for chills and fever.  Respiratory: Negative for cough and shortness of breath.   Cardiovascular: Negative for chest pain, palpitations and leg swelling.  Gastrointestinal: Negative for abdominal pain, nausea and vomiting.  per hpi   OBJECTIVE:  Today's Vitals   10/13/18 0944 10/13/18 0946  BP: (!) 158/82 130/78  Pulse: 74   Temp: 97.8 F (36.6 C)   TempSrc: Oral   SpO2: 95%   Weight: 192 lb (87.1 kg)   Height: 5' 8"  (1.727 m)    Body mass index is 29.19 kg/m.  Wt Readings from Last 3  Encounters:  10/13/18 192 lb (87.1 kg)  04/11/18 189 lb (85.7 kg)  02/06/18 191 lb (86.6 kg)    Physical Exam Vitals signs and nursing note reviewed.  Constitutional:      Appearance: He is well-developed.  HENT:     Head: Normocephalic and atraumatic.  Eyes:     Conjunctiva/sclera: Conjunctivae normal.     Pupils:  Pupils are equal, round, and reactive to light.  Neck:     Musculoskeletal: Neck supple.  Cardiovascular:     Rate and Rhythm: Normal rate and regular rhythm.     Heart sounds: No murmur. No friction rub. No gallop.   Pulmonary:     Effort: Pulmonary effort is normal.     Breath sounds: Normal breath sounds. No wheezing or rales.  Skin:    General: Skin is warm and dry.  Neurological:     Mental Status: He is alert and oriented to person, place, and time.     No results found for this or any previous visit (from the past 24 hour(s)).  No results found.   ASSESSMENT and PLAN  1. Generalized anxiety disorder Controlled. Continue current regime.  pmp reviewed  2. Hyperlipidemia, unspecified hyperlipidemia type Rechecking labs. If needed, will start crestor - Lipid panel - CMP14+EGFR  3. Benign prostatic hyperplasia, unspecified whether lower urinary tract symptoms present Controlled. Continue current regime.   4. Osteopenia determined by x-ray Discussed LFM. Checking vit D level. - Vitamin D, 25-hydroxy  5. Need for vaccination - Flu Vaccine QUAD High Dose(Fluad)  Other orders - Pneumococcal conjugate vaccine 13-valent IM  Return in about 6 months (around 04/12/2019).    Rutherford Guys, MD Primary Care at [facility] [place], [place] [address] Ph.  [phone] Fax [phone]

## 2018-10-14 LAB — CMP14+EGFR
ALT: 17 IU/L (ref 0–44)
AST: 26 IU/L (ref 0–40)
Albumin/Globulin Ratio: 1.9 (ref 1.2–2.2)
Albumin: 4.5 g/dL (ref 3.8–4.8)
Alkaline Phosphatase: 49 IU/L (ref 39–117)
BUN/Creatinine Ratio: 22 (ref 10–24)
BUN: 19 mg/dL (ref 8–27)
Bilirubin Total: 0.4 mg/dL (ref 0.0–1.2)
CO2: 24 mmol/L (ref 20–29)
Calcium: 9.5 mg/dL (ref 8.6–10.2)
Chloride: 100 mmol/L (ref 96–106)
Creatinine, Ser: 0.87 mg/dL (ref 0.76–1.27)
GFR calc Af Amer: 105 mL/min/{1.73_m2} (ref >59)
GFR calc non Af Amer: 91 mL/min/{1.73_m2} (ref >59)
Globulin, Total: 2.4 g/dL (ref 1.5–4.5)
Glucose: 104 mg/dL — ABNORMAL HIGH (ref 65–99)
Potassium: 4.6 mmol/L (ref 3.5–5.2)
Sodium: 137 mmol/L (ref 134–144)
Total Protein: 6.9 g/dL (ref 6.0–8.5)

## 2018-10-14 LAB — VITAMIN D 25 HYDROXY (VIT D DEFICIENCY, FRACTURES): Vit D, 25-Hydroxy: 35.8 ng/mL (ref 30.0–100.0)

## 2018-10-14 LAB — LIPID PANEL
Chol/HDL Ratio: 5.3 ratio — ABNORMAL HIGH (ref 0.0–5.0)
Cholesterol, Total: 240 mg/dL — ABNORMAL HIGH (ref 100–199)
HDL: 45 mg/dL (ref >39)
LDL Chol Calc (NIH): 179 mg/dL — ABNORMAL HIGH (ref 0–99)
Triglycerides: 93 mg/dL (ref 0–149)
VLDL Cholesterol Cal: 16 mg/dL (ref 5–40)

## 2018-10-17 MED FILL — DICLOFENAC SODIUM 1 % GEL: 1 | 10 days supply | Qty: 200 | Fill #5

## 2018-10-23 ENCOUNTER — Other Ambulatory Visit: Payer: Self-pay | Admitting: Family Medicine

## 2018-10-23 MED ORDER — ROSUVASTATIN CALCIUM 10 MG PO TABS: 10.0000 mg | ORAL_TABLET | Freq: Every day | ORAL | 3 refills | Status: DC

## 2018-10-23 MED FILL — ROSUVASTATIN CALCIUM 10 MG: 10 | 90 days supply | Qty: 90 | Fill #0

## 2018-10-28 ENCOUNTER — Other Ambulatory Visit: Payer: Self-pay | Admitting: Family Medicine

## 2018-10-29 NOTE — Telephone Encounter (Signed)
Please advise on refills   Controlled   Last seen 10/13/18

## 2018-10-30 MED FILL — ALPRAZolam 0.5 MG TABS: 0.5 | 30 days supply | Qty: 120 | Fill #0

## 2018-11-24 MED FILL — METHOTREXATE 25 MG/ML VIAL: 50 | 42 days supply | Qty: 4 | Fill #2

## 2018-11-24 MED FILL — ALPRAZolam 0.5 MG TABS: 0.5 | 30 days supply | Qty: 120 | Fill #1

## 2018-11-24 MED FILL — DICLOFENAC SODIUM 1 % GEL: 1 | 30 days supply | Qty: 200 | Fill #0

## 2018-12-22 MED FILL — ALPRAZolam 0.5 MG TABS: 0.5 | 30 days supply | Qty: 120 | Fill #2

## 2019-01-21 MED FILL — OMEPRAZOLE 20 MG CAP: 20 | 90 days supply | Qty: 180 | Fill #3

## 2019-01-21 MED FILL — METHOTREXATE SODIUM (PF) 50: 50 | 28 days supply | Qty: 4 | Fill #0

## 2019-01-21 MED FILL — SERTRALINE HCL 100 MG TAB: 100 | 90 days supply | Qty: 90 | Fill #1

## 2019-01-21 MED FILL — ALPRAZolam 0.5 MG TABS: 0.5 | 30 days supply | Qty: 120 | Fill #3

## 2019-01-21 MED FILL — ROSUVASTATIN CALCIUM 10 MG: 10 | 90 days supply | Qty: 90 | Fill #1

## 2019-01-21 MED FILL — DICLOFENAC SODIUM 1 % GEL: 1 | 30 days supply | Qty: 200 | Fill #1

## 2019-01-22 ENCOUNTER — Other Ambulatory Visit: Payer: Self-pay

## 2019-01-22 NOTE — Patient Outreach (Signed)
[facility]) Care Management  01/22/2019  JAYD GASCHLER 05/31/1955 [id number]   Medication Adherence call to Mr. [name] [facility] Identifiers Verify spoke with patient he is past due on Rosuvastatin 10 mg ,patient explain he takes 1 tablet daily,patient has already pick up from the pharmacy and has enough for 90 days. Mr. Molder is showing past due under [place].  [facility] Management Direct Dial [phone]  Fax [phone] [email or web address]

## 2019-02-03 MED FILL — METHOTREXATE SODIUM (PF) 50: 50 | 28 days supply | Qty: 4 | Fill #0

## 2019-02-13 MED FILL — DICLOFENAC SODIUM 1 % GEL: 1 | 30 days supply | Qty: 200 | Fill #2

## 2019-02-13 MED FILL — TADALAFIL 5 MG TABS: 5 | 90 days supply | Qty: 90 | Fill #1

## 2019-02-20 MED FILL — ALPRAZolam 0.5 MG TABS: 0.5 | 30 days supply | Qty: 120 | Fill #4

## 2019-03-04 ENCOUNTER — Encounter: Payer: Self-pay | Admitting: Gastroenterology

## 2019-03-20 ENCOUNTER — Other Ambulatory Visit: Payer: Self-pay

## 2019-03-20 ENCOUNTER — Ambulatory Visit (AMBULATORY_SURGERY_CENTER): Payer: Self-pay | Admitting: *Deleted

## 2019-03-20 VITALS — Temp 97.1°F | Ht 68.0 in | Wt 190.0 lb

## 2019-03-20 DIAGNOSIS — Z01818 Encounter for other preprocedural examination: Secondary | ICD-10-CM

## 2019-03-20 DIAGNOSIS — Z86010 Personal history of colonic polyps: Secondary | ICD-10-CM

## 2019-03-20 MED ORDER — SUPREP BOWEL PREP KIT 17.5-3.13-1.6 GM/177ML PO SOLN: 1.0000 | Freq: Once | ORAL | 0 refills | Status: AC

## 2019-03-20 MED FILL — SUPREP BOWEL PREP KIT: 17.5-3.13-1 | 1 days supply | Qty: 354 | Fill #0

## 2019-03-20 NOTE — Progress Notes (Signed)
No egg or soy allergy known to patient  No issues with past sedation with any surgeries  or procedures, no intubation problems  No diet pills per patient No home 02 use per patient  No blood thinners per patient  Pt denies issues with constipation  No A fib or A flutter  EMMI video sent to pt's e mail  2 day Suprep/Miralax for this pt   Due to the COVID-19 pandemic we are asking patients to follow these guidelines. Please only bring one care partner. Please be aware that your care partner may wait in the car in the parking lot or if they feel like they will be too hot to wait in the car, they may wait in the lobby on the 4th floor. All care partners are required to wear a mask the entire time (we do not have any that we can provide them), they need to practice social distancing, and we will do a Covid check for all patient's and care partners when you arrive. Also we will check their temperature and your temperature. If the care partner waits in their car they need to stay in the parking lot the entire time and we will call them on their cell phone when the patient is ready for discharge so they can bring the car to the front of the building. Also all patient's will need to wear a mask into building.

## 2019-03-26 ENCOUNTER — Other Ambulatory Visit: Payer: Self-pay | Admitting: Family Medicine

## 2019-03-26 MED FILL — DICLOFENAC SODIUM 1 % GEL: 1 | 30 days supply | Qty: 200 | Fill #3

## 2019-03-26 MED FILL — ALPRAZolam 0.5 MG TABS: 0.5 | 30 days supply | Qty: 120 | Fill #5

## 2019-03-26 NOTE — Telephone Encounter (Signed)
Requested Prescriptions  Pending Prescriptions Disp Refills  . sertraline (ZOLOFT) 100 MG tablet [Pharmacy Med Name: SERTRALINE HCL 100 MG TAB 100 Tablet] 90 tablet 1    Sig: TAKE 1 TABLET BY MOUTH ONCE DAILY     Psychiatry:  Antidepressants - SSRI Passed - 03/26/2019 10:18 AM      Passed - Valid encounter within last 6 months    Recent Outpatient Visits          5 months ago Generalized anxiety disorder   [facility], Lilia Argue, MD   11 months ago Breast mass in male   [facility], Lilia Argue, MD   11 months ago Annual physical exam   [facility], Lilia Argue, MD   1 year ago Screening for colon cancer   [facility], Lilia Argue, MD   1 year ago Generalized anxiety disorder   [facility], Lilia Argue, MD             . omeprazole (PRILOSEC) 20 MG capsule [Pharmacy Med Name: OMEPRAZOLE 20 MG CAP 20 Capsule] 180 capsule 3    Sig: TAKE 1 CAPSULE BY MOUTH TWICE [place]     Gastroenterology: Proton Pump Inhibitors Passed - 03/26/2019 10:18 AM      Passed - Valid encounter within last 12 months    Recent Outpatient Visits          5 months ago Generalized anxiety disorder   [facility], Lilia Argue, MD   11 months ago Breast mass in male   [facility], Lilia Argue, MD   11 months ago Annual physical exam   [facility], Lilia Argue, MD   1 year ago Screening for colon cancer   [facility], Lilia Argue, MD   1 year ago Generalized anxiety disorder   [facility], Lilia Argue, MD             . tadalafil (CIALIS) 5 MG tablet [Pharmacy Med Name: TADALAFIL 5 MG TABS 5 Tablet] 90 tablet 1    Sig: TAKE 1 TABLET BY MOUTH AT BEDTIME     Urology: Erectile Dysfunction Agents Passed - 03/26/2019 10:18 AM      Passed - Last BP in normal range    BP Readings from Last 1 Encounters:  10/13/18 130/78         Passed - Valid  encounter within last 12 months    Recent Outpatient Visits          5 months ago Generalized anxiety disorder   [facility], Lilia Argue, MD   11 months ago Breast mass in male   [facility], Lilia Argue, MD   11 months ago Annual physical exam   [facility], Lilia Argue, MD   1 year ago Screening for colon cancer   [facility], Lilia Argue, MD   1 year ago Generalized anxiety disorder   [facility], Lilia Argue, MD

## 2019-03-30 ENCOUNTER — Encounter: Payer: Self-pay | Admitting: Gastroenterology

## 2019-03-30 MED FILL — SERTRALINE HCL 100 MG TAB: 100 | 90 days supply | Qty: 90 | Fill #0

## 2019-03-30 MED FILL — TADALAFIL 5 MG TABS: 5 | 90 days supply | Qty: 90 | Fill #0

## 2019-03-30 MED FILL — OMEPRAZOLE 20 MG CAP: 20 | 90 days supply | Qty: 180 | Fill #0

## 2019-03-31 ENCOUNTER — Ambulatory Visit (INDEPENDENT_AMBULATORY_CARE_PROVIDER_SITE_OTHER): Payer: Medicare Other

## 2019-03-31 ENCOUNTER — Other Ambulatory Visit: Payer: Self-pay | Admitting: Gastroenterology

## 2019-03-31 DIAGNOSIS — Z1159 Encounter for screening for other viral diseases: Secondary | ICD-10-CM | POA: Diagnosis not present

## 2019-03-31 LAB — SARS CORONAVIRUS 2 (TAT 6-24 HRS): SARS Coronavirus 2: NEGATIVE

## 2019-04-03 ENCOUNTER — Encounter: Payer: Self-pay | Admitting: Gastroenterology

## 2019-04-03 ENCOUNTER — Telehealth: Payer: Self-pay

## 2019-04-03 ENCOUNTER — Other Ambulatory Visit: Payer: Self-pay

## 2019-04-03 ENCOUNTER — Ambulatory Visit (AMBULATORY_SURGERY_CENTER): Payer: Medicare Other | Admitting: Gastroenterology

## 2019-04-03 VITALS — BP 132/67 | HR 69 | Temp 97.8°F | Resp 17 | Ht 68.0 in | Wt 190.0 lb

## 2019-04-03 DIAGNOSIS — D122 Benign neoplasm of ascending colon: Secondary | ICD-10-CM

## 2019-04-03 DIAGNOSIS — D123 Benign neoplasm of transverse colon: Secondary | ICD-10-CM | POA: Diagnosis not present

## 2019-04-03 DIAGNOSIS — Z86010 Personal history of colonic polyps: Secondary | ICD-10-CM | POA: Diagnosis not present

## 2019-04-03 DIAGNOSIS — D124 Benign neoplasm of descending colon: Secondary | ICD-10-CM | POA: Diagnosis not present

## 2019-04-03 DIAGNOSIS — D125 Benign neoplasm of sigmoid colon: Secondary | ICD-10-CM | POA: Diagnosis not present

## 2019-04-03 DIAGNOSIS — K640 First degree hemorrhoids: Secondary | ICD-10-CM

## 2019-04-03 MED ORDER — SODIUM CHLORIDE 0.9 % IV SOLN: 500.0000 mL | INTRAVENOUS | Status: AC

## 2019-04-03 NOTE — Progress Notes (Signed)
PT taken to PACU. Monitors in place. VSS. Report given to RN. 

## 2019-04-03 NOTE — Op Note (Signed)
[facility] Patient Name: Grant Hunter Procedure Date: 04/03/2019 10:44 AM MRN: [id number] Endoscopist: Gerrit Heck , MD Age: 64 Referring MD:  Date of Birth: 1955/07/23 Gender: Male Account #: [id number] Procedure:                Colonoscopy Indications:              Surveillance: History of adenomatous polyps,                            inadequate prep on last exam (<43yr)                           64 yo male with a family history of colon polyps                            and personal history of adenomatous polyps. Last                            colonoscopy in 01/2018 with 5 subcentimeter tubular                            adenomas, but due to suboptimal bowel preparation,                            recommended repeat colonoscopy in 1 year.                            Otherwise, no active Gi symptoms. Medicines:                Monitored Anesthesia Care Procedure:                Pre-Anesthesia Assessment:                           - Prior to the procedure, a History and Physical                            was performed, and patient medications and                            allergies were reviewed. The patient's tolerance of                            previous anesthesia was also reviewed. The risks                            and benefits of the procedure and the sedation                            options and risks were discussed with the patient.                            All questions were answered, and informed consent  was obtained. Prior Anticoagulants: The patient has                            taken no previous anticoagulant or antiplatelet                            agents. ASA Grade Assessment: II - A patient with                            mild systemic disease. After reviewing the risks                            and benefits, the patient was deemed in                            satisfactory condition to undergo the procedure.                   After obtaining informed consent, the colonoscope                            was passed under direct vision. Throughout the                            procedure, the patient's blood pressure, pulse, and                            oxygen saturations were monitored continuously. The                            Colonoscope was introduced through the anus and                            advanced to the the cecum, identified by                            appendiceal orifice and ileocecal valve. The                            colonoscopy was performed without difficulty. The                            patient tolerated the procedure well. The quality                            of the bowel preparation was adequate. The                            ileocecal valve, appendiceal orifice, and rectum                            were photographed. Scope In: 10:53:28 AM Scope Out: 11:12:46 AM Scope Withdrawal Time: 0 hours 12 minutes 49 seconds  Total Procedure Duration: 0 hours 19 minutes 18 seconds  Findings:  The perianal and digital rectal examinations were                            normal.                           Five sessile polyps were found in the sigmoid                            colon, descending colon (2), transverse colon and                            ascending colon. The polyps were 4 to 12 mm in                            size. These polyps were removed with a cold snare.                            Resection and retrieval were complete. Estimated                            blood loss was minimal.                           Non-bleeding internal hemorrhoids were found during                            retroflexion. The hemorrhoids were small. Complications:            No immediate complications. Estimated Blood Loss:     Estimated blood loss was minimal. Impression:               - Five 4 to 12 mm polyps in the sigmoid colon, in                             the descending colon, in the transverse colon and                            in the ascending colon, removed with a cold snare.                            Resected and retrieved.                           - Non-bleeding internal hemorrhoids. Recommendation:           - Patient has a contact number available for                            emergencies. The signs and symptoms of potential                            delayed complications were discussed with the  patient. Return to normal activities tomorrow.                            Written discharge instructions were provided to the                            patient.                           - Resume previous diet.                           - Continue present medications.                           - Await pathology results.                           - Repeat colonoscopy in 3 years for surveillance.                           - Return to GI clinic PRN. Gerrit Heck, MD 04/03/2019 11:19:01 AM

## 2019-04-03 NOTE — Progress Notes (Signed)
Called to room to assist during endoscopic procedure.  Patient ID and intended procedure confirmed with present staff. Received instructions for my participation in the procedure from the performing physician.  

## 2019-04-03 NOTE — Telephone Encounter (Signed)
Upon patient's discharge after colonoscopy, patient's wife states they were unclear regarding the results from patient's endoscopy and other tests from January of 2020. Patient also states he'd like to stop taking Omeprazole due to the fact he doesn't like to take too many meds. When asked, he states he does experience heartburn from time to time. I told patient and his wife that I would pass this on to Dr. Bryan Lemma and try to get an answer for them regarding results of previous endoscopy and medication question.

## 2019-04-03 NOTE — Patient Instructions (Signed)
YOU HAD AN ENDOSCOPIC PROCEDURE TODAY AT [facility]:   Refer to the procedure report that was given to you for any specific questions about what was found during the examination.  If the procedure report does not answer your questions, please call your gastroenterologist to clarify.  If you requested that your care partner not be given the details of your procedure findings, then the procedure report has been included in a sealed envelope for you to review at your convenience later.  YOU SHOULD EXPECT: Some feelings of bloating in the abdomen. Passage of more gas than usual.  Walking can help get rid of the air that was put into your GI tract during the procedure and reduce the bloating. If you had a lower endoscopy (such as a colonoscopy or flexible sigmoidoscopy) you may notice spotting of blood in your stool or on the toilet paper. If you underwent a bowel prep for your procedure, you may not have a normal bowel movement for a few days.  Please Note:  You might notice some irritation and congestion in your nose or some drainage.  This is from the oxygen used during your procedure.  There is no need for concern and it should clear up in a day or so.  SYMPTOMS TO REPORT IMMEDIATELY:   Following lower endoscopy (colonoscopy or flexible sigmoidoscopy):  Excessive amounts of blood in the stool  Significant tenderness or worsening of abdominal pains  Swelling of the abdomen that is new, acute  Fever of 100F or higher   For urgent or emergent issues, a gastroenterologist can be reached at any hour by calling [phone]. Do not use MyChart messaging for urgent concerns.    DIET:  We do recommend a small meal at first, but then you may proceed to your regular diet.  Drink plenty of fluids but you should avoid alcoholic beverages for 24 hours.  MEDICATIONS: Continue present medications.  Follow up with Dr. Bryan Lemma in his office as needed.  Please see handouts given to you  by your recovery nurse.  ACTIVITY:  You should plan to take it easy for the rest of today and you should NOT DRIVE or use heavy machinery until tomorrow (because of the sedation medicines used during the test).    FOLLOW UP: Our staff will call the number listed on your records 48-72 hours following your procedure to check on you and address any questions or concerns that you may have regarding the information given to you following your procedure. If we do not reach you, we will leave a message.  We will attempt to reach you two times.  During this call, we will ask if you have developed any symptoms of COVID 19. If you develop any symptoms (ie: fever, flu-like symptoms, shortness of breath, cough etc.) before then, please call [phone].  If you test positive for Covid 19 in the 2 weeks post procedure, please call and report this information to [place].    If any biopsies were taken you will be contacted by phone or by letter within the next 1-3 weeks.  Please call us at [phone] if you have not heard about the biopsies in 3 weeks.   Thank you for allowing [place] to provide for your healthcare needs today.   SIGNATURES/CONFIDENTIALITY: You and/or your care partner have signed paperwork which will be entered into your electronic medical record.  These signatures attest to the fact that that the information above on your After Visit Summary  has been reviewed and is understood.  Full responsibility of the confidentiality of this discharge information lies with you and/or your care-partner. 

## 2019-04-03 NOTE — Telephone Encounter (Signed)
Records from 01/2018 reviewed. EM performed and n/f hiatal hernia, but o/w normal motility. pH study with significant esophageal acid exposure and positive symptom correlation, c/w GERD. EGD with a 2.5 cm hiatal hernia.   Recommend scheduling f/u appt with me in clinic to discuss ongoing control of reflux. Given prior studies and desire to come off medications, would be reasonable to discuss antrireflux surgical options, to include TIF vs lap hernia repair with TIF, as a means to better control reflux and stop acid suppression meds.

## 2019-04-06 ENCOUNTER — Telehealth: Payer: Self-pay

## 2019-04-06 NOTE — Telephone Encounter (Signed)
Upon discharging patient post-colonoscopy, he and his wife had questions regarding his previous EGD back in January of 2020, message routed to Dr. Bryan Lemma. I returned patient's call and went over findings of those exams with him and instructed him to make an appointment with Dr. Bryan Lemma in his office to f/u on options to treat his reflux symptoms. Patient verbalizes understanding and states he will call the office to make an appointment.

## 2019-04-07 ENCOUNTER — Telehealth: Payer: Self-pay

## 2019-04-07 NOTE — Telephone Encounter (Signed)
LMOM for patient to call back.

## 2019-04-07 NOTE — Telephone Encounter (Signed)
Spoke to patient. Appointment has been scheduled for 04/08/19 to discuss reflux and possible surgical options.

## 2019-04-07 NOTE — Telephone Encounter (Signed)
  Follow up Call-  Call back number 04/03/2019 02/06/2018  Post procedure Call Back phone  # [phone]  Permission to leave phone message Yes Yes  Some recent data might be hidden     Patient questions:  Do you have a fever, pain , or abdominal swelling? No. Pain Score  0 *  Have you tolerated food without any problems? Yes.    Have you been able to return to your normal activities? Yes.    Do you have any questions about your discharge instructions: Diet   No. Medications  No. Follow up visit  No.  Do you have questions or concerns about your Care? No.  Actions: * If pain score is 4 or above: No action needed, pain <4.  1. Have you developed a fever since your procedure? No  2.   Have you had an respiratory symptoms (SOB or cough) since your procedure? No  3.   Have you tested positive for COVID 19 since your procedure No  4.   Have you had any family members/close contacts diagnosed with the COVID 19 since your procedure?  No   If yes to any of these questions please route to Joylene John, RN and Alphonsa Gin, RN.

## 2019-04-08 ENCOUNTER — Other Ambulatory Visit: Payer: Self-pay

## 2019-04-08 ENCOUNTER — Encounter: Payer: Self-pay | Admitting: Gastroenterology

## 2019-04-08 ENCOUNTER — Ambulatory Visit: Payer: Medicare Other | Admitting: Gastroenterology

## 2019-04-08 VITALS — BP 140/76 | HR 69 | Temp 96.6°F | Ht 68.0 in | Wt 192.5 lb

## 2019-04-08 DIAGNOSIS — K219 Gastro-esophageal reflux disease without esophagitis: Secondary | ICD-10-CM | POA: Diagnosis not present

## 2019-04-08 DIAGNOSIS — K449 Diaphragmatic hernia without obstruction or gangrene: Secondary | ICD-10-CM | POA: Diagnosis not present

## 2019-04-08 DIAGNOSIS — Z86010 Personal history of colonic polyps: Secondary | ICD-10-CM

## 2019-04-08 DIAGNOSIS — Z7189 Other specified counseling: Secondary | ICD-10-CM

## 2019-04-08 NOTE — Patient Instructions (Signed)
Please start taking a daily fiber supplement such as citrucel, benefiber, metamucil, or fiber choice.   It was a pleasure to see you today!  Vito Cirigliano, D.O.

## 2019-04-08 NOTE — Progress Notes (Addendum)
P  Chief Complaint:    GERD, hiatal hernia  GI History: 64 year old male with a long standing hx of reflux that has been well controlled with omeprazole 20 mg PO BID, but wishes to discuss alternate modalities for reflux control with a goal to stop or significantly reduce acid suppression therapy.  Index symptoms include heartburn, regurgitation.  Prior history of dysphagia, completely resolved with EGD with dilation nearly 10 years ago.  No recurrence.  Reflux evaluation to date: -pH/impedance (02/2018): significant esophageal acid exposure ([place] 23.7; pH <4 time 7.6%) and positive symptom correlation with HB, c/w GERD -Esophageal Manometry (02/2018): Hiatal hernia, otherwise normal  -GERD HRQL Questionnaire score: 29/50 (on PPI)  Endoscopic history: -Colonoscopy (03/2019, Dr. Bryan Lemma): 5 polyps (4-12 mm), internal hemorrhoids.  Repeat in 3 years -EGD (01/2018, Dr. Bryan Lemma): 2 cm axial height/2.5 cm transverse width hiatal hernia, Hill grade 3 valve, normal Z-line, nonobstructing Schatzki's ring, -Colonoscopy (01/2018, Dr. Bryan Lemma): 5 tubular adenomas (all <1 cm), fair prep, internal hemorrhoids.  Repeat in 1 year due to prep - Colonoscopy (10/2012, Dr. Mia Creek): Normal.  Recommended repeat in 5 years due to prior history of colon polyps - Colonoscopy at age 75 (approximately 2007) with polyps with recommendation to repeat in 5 years.  No report available for review. -EGD approx 8 years ago with esophageal dilation with durable relief.   HPI:     Patient is a 64 y.o. male presenting to the Gastroenterology Clinic for follow-up.  As outlined above, longstanding history of reflux.  Previously discussed antireflux surgical options on initial appointment 12/2017.  He tabled further discussion of surgery due to COVID-19 pandemic, but presents today to discuss ongoing control of reflux and discuss antireflux surgical options, namely cTIF.  Reflux symptoms currently relatively well  controlled with omeprazole 20 mg bid. Breakthrough with any missed doses, eating close to bedtime, overeating.  He is interested in antireflux surgery as a means to better control his reflux and to stop or significantly reduce acid suppression therapy.  Otherwise, no dysphagia, odynophagia.  Colonoscopy completed earlier this month notable for tubular adenomas, with recommended repeat in 3 years.  Review of systems:     No chest pain, no SOB, no fevers, no urinary sx   Past Medical History:  Diagnosis Date  . Allergy   . Anxiety   . Colon polyps   . Enlarged prostate   . Esophageal stricture   . GERD (gastroesophageal reflux disease)   . Hyperlipidemia    caused by a medication he was taking - labs after med d/c'd were normal  . Legally blind in left eye, as defined in [place]    at age 62, from infection, has left eye prosthesis  . Osteoarthritis   . Pneumonia   . Rheumatoid arthritis ([place])    Dr Lincoln Maxin, [facility]    Patient's surgical history, family medical history, social history, medications and allergies were all reviewed in Epic    Current Outpatient Medications  Medication Sig Dispense Refill  . albuterol (PROVENTIL HFA;VENTOLIN HFA) 108 (90 Base) MCG/ACT inhaler Inhale 2 puffs into the lungs every 6 (six) hours as needed for wheezing or shortness of breath. 1 Inhaler 0  . ALPRAZolam (XANAX) 0.5 MG tablet TAKE 1 TABLET BY MOUTH EVERY 6 HOURS AS NEEDED FOR ANXIETY 120 tablet 5  . b complex vitamins capsule Take 1 capsule by mouth daily.    Marland Kitchen CALCIUM-MAGNESIUM-ZINC PO Take 1 tablet by mouth daily.    . Cholecalciferol (VITAMIN D3)  2000 UNITS capsule Take 2,000 Units by mouth daily.    . diclofenac Sodium (VOLTAREN) 1 % GEL     . Diclofenac Sodium 1 % CREA Place onto the skin as needed.    . Dietary Management Product (RHEUMATE) CAPS Take 1 capsule by mouth daily.    Marland Kitchen erythromycin ophthalmic ointment Place 1 application into the left eye at bedtime. 3.5 g 1  . etanercept  (ENBREL SURECLICK) 50 MG/ML injection Inject 0.98 mLs (50 mg total) into the skin once a week. Inject 1 syringe SQ once a week 4 Syringe 3  . folic acid (FOLVITE) 1 MG tablet Take 1 mg by mouth daily.    . Melatonin 3 MG TBDP Take 3 mg by mouth at bedtime.    . METHOTREXATE SODIUM IJ Inject 60 mg into the muscle once a week.     Marland Kitchen omeprazole (PRILOSEC) 20 MG capsule TAKE 1 CAPSULE BY MOUTH TWICE DAILY BEFORE MEALS 180 capsule 3  . rosuvastatin (CRESTOR) 10 MG tablet Take 1 tablet (10 mg total) by mouth at bedtime. 90 tablet 3  . sertraline (ZOLOFT) 100 MG tablet TAKE 1 TABLET BY MOUTH ONCE DAILY 90 tablet 1  . tadalafil (CIALIS) 5 MG tablet TAKE 1 TABLET BY MOUTH AT BEDTIME 90 tablet 1  . VITAMIN E PO Take 1 tablet by mouth daily.     Current Facility-Administered Medications  Medication Dose Route Frequency Provider Last Rate Last Admin  . 0.9 %  sodium chloride infusion  500 mL Intravenous Continuous Lorilyn Laitinen V, DO        Physical Exam:     BP 140/76   Pulse 69   Temp (!) 96.6 F (35.9 C)   Ht 5\' 8"  (1.727 m)   Wt 192 lb 8 oz (87.3 kg)   BMI 29.27 kg/m   GENERAL:  Pleasant male in NAD PSYCH: : Cooperative, normal affect EENT: Left eye prosthesis, mucous membranes moist ABDOMEN:  Nondistended, soft, nontender.  NEURO: Alert and oriented x 3, no focal neurologic deficits   IMPRESSION and PLAN:    1) GERD 2) Hiatal hernia Longstanding history of reflux, diabetes responsive to PPI therapy, but requires ongoing bid dosing with breakthrough symptoms with any missed doses.  Positive pH/impedance study, with normal esophageal manometry, consistent with GERD.  He would like to proceed with antireflux surgery and hiatal hernia repair as a means to better control his reflux and stop or significantly reduce acid suppression therapy.  We discussed antireflux surgical options at length today, and he would like to proceed with concomitant laparoscopic hiatal hernia repair/TIF  (cTIF).  1 issue though is his rheumatoid arthritis, currently treated with MTX weekly injections and etanercept weekly injections.  He has some reservation about holding the etanercept, as he was previously treated with Morrie Sheldon which was helpful for a left shoulder surgery, but lost medication efficacy afterwards and had to switch therapy.  He is fearful about losing efficacy with the etanercept.  Review of current guidelines regarding perioperative management of etanercept mostly relates to orthopedic surgeries, but prevailing thought seems to be holding medication 7-14 days prior to surgery, then restarting 7-14 days afterwards.  This would essentially mean holding 2 doses.  I will discuss this issue with Dr. Redmond Pulling at [place] regarding concomitant [facility] repair and perioperative management of etanercept.  Pending that discussion, will also likely engage his Rheumatologist for input.  In the meantime, continue Prilosec, antireflux lifestyle/dietary modifications.  3) History of colon polyps: -Multiple adenomatous polyps  resected over 2 colonoscopies in 2020/2021, including 1 >10 mm.  Repeat 3 years -Repeat colonoscopy in 2024 for ongoing polyp surveillance  4) Chronic PPI therapy: I have reviewed the indications, risks, and benefits of PPI therapy with the patient today. I have discussed studies that raise ? of increased osteoporosis, dementia, and kidney failure and explained that these studies show very weak associations of unclear significance and not clear cause and effect. We did discuss the potential for vitamin malabsorption, to include magnesium (very rare), calcium (easily modifiable with Calcium Citrate supplement), vitamin B12 (again, correctable with oral B12 supplement), and iron (although rarely clinically significant outside patients who require iron supplementation previously), and can monitor each of these periodically with routine labs. We have agreed to continue PPI treatment in this  case.  I spent 45 minutes of time, including in depth chart review, independent review of results as outlined above, communicating results with the patient directly, communicating with other physicians, face-to-face time with the patient, coordinating care, ordering studies and medications as appropriate, and documentation.           Lavena Bullion ,DO, FACG 04/08/2019, 10:41 AM

## 2019-04-21 DIAGNOSIS — M0579 Rheumatoid arthritis with rheumatoid factor of multiple sites without organ or systems involvement: Secondary | ICD-10-CM | POA: Diagnosis not present

## 2019-04-27 ENCOUNTER — Other Ambulatory Visit: Payer: Self-pay | Admitting: Family Medicine

## 2019-04-27 MED FILL — ROSUVASTATIN CALCIUM 10 MG: 10 | 90 days supply | Qty: 90 | Fill #2

## 2019-04-27 MED FILL — ALPRAZolam 0.5 MG TABS: 0.5 | 30 days supply | Qty: 120 | Fill #0

## 2019-04-27 NOTE — Telephone Encounter (Signed)
Requested medication (s) are due for refill today yes  Requested medication (s) are on the active medication list yes  Future visit scheduled no  Last refill: 10/30/18 5 RF  Notes to clinic: Request for non delegated Rx  Requested Prescriptions  Pending Prescriptions Disp Refills   ALPRAZolam (XANAX) 0.5 MG tablet [Pharmacy Med Name: ALPRAZolam 0.5 MG TABS 0.5 Tablet] 120 tablet 5    Sig: TAKE 1 TABLET BY MOUTH EVERY 6 HOURS AS NEEDED FOR ANXIETY      Not Delegated - Psychiatry:  Anxiolytics/Hypnotics Failed - 04/27/2019  9:33 AM      Failed - This refill cannot be delegated      Failed - Urine Drug Screen completed in last 360 days.      Failed - Valid encounter within last 6 months    Recent Outpatient Visits           6 months ago Generalized anxiety disorder   [facility], Lilia Argue, MD   1 year ago Breast mass in male   [facility], Lilia Argue, MD   1 year ago Annual physical exam   [facility], Lilia Argue, MD   1 year ago Screening for colon cancer   [facility], Lilia Argue, MD   1 year ago Generalized anxiety disorder   [facility], Lilia Argue, MD                  Requested Prescriptions  Pending Prescriptions Disp Refills   ALPRAZolam (XANAX) 0.5 MG tablet [Pharmacy Med Name: ALPRAZolam 0.5 MG TABS 0.5 Tablet] 120 tablet 5    Sig: TAKE 1 TABLET BY MOUTH EVERY 6 HOURS AS NEEDED FOR ANXIETY      Not Delegated - Psychiatry:  Anxiolytics/Hypnotics Failed - 04/27/2019  9:33 AM      Failed - This refill cannot be delegated      Failed - Urine Drug Screen completed in last 360 days.      Failed - Valid encounter within last 6 months    Recent Outpatient Visits           6 months ago Generalized anxiety disorder   [facility], Lilia Argue, MD   1 year ago Breast mass in male   [facility], Lilia Argue, MD   1 year ago Annual physical exam   [facility], Lilia Argue, MD   1 year ago Screening for colon cancer   [facility], Lilia Argue, MD   1 year ago Generalized anxiety disorder   [facility], Lilia Argue, MD

## 2019-04-27 NOTE — Telephone Encounter (Signed)
pmp reviewd, appropriate meds refilled 

## 2019-04-27 NOTE — Telephone Encounter (Signed)
Patient is requesting a refill of the following medications: Requested Prescriptions   Pending Prescriptions Disp Refills  . ALPRAZolam (XANAX) 0.5 MG tablet [Pharmacy Med Name: ALPRAZolam 0.5 MG TABS 0.5 Tablet] 120 tablet 5    Sig: TAKE 1 TABLET BY MOUTH EVERY 6 HOURS AS NEEDED FOR ANXIETY    Date of patient request: 04/27/2019 Last office visit: 10/13/2018 Date of last refill: 10/30/2018 Last refill amount: 120 tablets 5 refills  Follow up time period per chart: N/A

## 2019-05-21 DIAGNOSIS — M79641 Pain in right hand: Secondary | ICD-10-CM | POA: Diagnosis not present

## 2019-05-21 DIAGNOSIS — Z79899 Other long term (current) drug therapy: Secondary | ICD-10-CM | POA: Diagnosis not present

## 2019-05-21 DIAGNOSIS — M1711 Unilateral primary osteoarthritis, right knee: Secondary | ICD-10-CM | POA: Diagnosis not present

## 2019-05-21 DIAGNOSIS — M25511 Pain in right shoulder: Secondary | ICD-10-CM | POA: Diagnosis not present

## 2019-05-21 DIAGNOSIS — M150 Primary generalized (osteo)arthritis: Secondary | ICD-10-CM | POA: Diagnosis not present

## 2019-05-21 DIAGNOSIS — M0579 Rheumatoid arthritis with rheumatoid factor of multiple sites without organ or systems involvement: Secondary | ICD-10-CM | POA: Diagnosis not present

## 2019-05-26 MED FILL — ALPRAZolam 0.5 MG TABS: 0.5 | 30 days supply | Qty: 120 | Fill #1

## 2019-05-28 MED FILL — METHOTREXATE SODIUM (PF) 50: 50 | 28 days supply | Qty: 8 | Fill #0

## 2019-06-24 MED FILL — ALPRAZolam 0.5 MG TABS: 0.5 | 30 days supply | Qty: 120 | Fill #2

## 2019-06-24 MED FILL — TADALAFIL 5 MG TABS: 5 | 90 days supply | Qty: 90 | Fill #1

## 2019-06-24 MED FILL — METHOTREXATE SODIUM (PF) 50: 50 | 13 days supply | Qty: 4 | Fill #1

## 2019-06-24 MED FILL — OMEPRAZOLE 20 MG CAP: 20 | 90 days supply | Qty: 180 | Fill #1

## 2019-06-24 MED FILL — SERTRALINE HCL 100 MG TAB: 100 | 90 days supply | Qty: 90 | Fill #1

## 2019-07-21 ENCOUNTER — Ambulatory Visit (INDEPENDENT_AMBULATORY_CARE_PROVIDER_SITE_OTHER): Payer: Medicare Other | Admitting: Registered Nurse

## 2019-07-21 ENCOUNTER — Encounter: Payer: Self-pay | Admitting: Registered Nurse

## 2019-07-21 ENCOUNTER — Other Ambulatory Visit: Payer: Self-pay

## 2019-07-21 VITALS — BP 132/87 | HR 82 | Temp 97.8°F | Resp 17 | Ht 68.0 in | Wt 193.4 lb

## 2019-07-21 DIAGNOSIS — S39012A Strain of muscle, fascia and tendon of lower back, initial encounter: Secondary | ICD-10-CM | POA: Diagnosis not present

## 2019-07-21 DIAGNOSIS — Z23 Encounter for immunization: Secondary | ICD-10-CM | POA: Diagnosis not present

## 2019-07-21 MED ORDER — METHOCARBAMOL 500 MG PO TABS: 500.0000 mg | ORAL_TABLET | Freq: Four times a day (QID) | ORAL | 0 refills | Status: AC

## 2019-07-21 MED FILL — METHOCARBAMOL 500 MG TABS: 500 | 15 days supply | Qty: 60 | Fill #0

## 2019-07-21 MED FILL — ALPRAZolam 0.5 MG TABS: 0.5 | 30 days supply | Qty: 120 | Fill #3

## 2019-07-21 NOTE — Progress Notes (Signed)
Acute Office Visit  Subjective:    Patient ID: Grant Hunter, male    DOB: 02/23/1955, 64 y.o.   MRN: [id number]  Chief Complaint  Patient presents with  . Back Pain    Patient states that he was doing yard work friday and cut the grass when he was pushing the lawn mower in the basement he noticed he was experiencing some pain then. Per patient he went in the house to try to relax and that evening until sunday he had to use a cane to get around due to servere pain. He has taken some Tramadol and had no relief.    HPI Patient is in today for lower back pain  Onset late last week into the weekend. Steadily improving. Tight and aching pain. It is lower back and lateral, no midline pain. Denies saddle symptoms and lower extremity symptoms. No headaches or sensory changes.   Has been doing a lot of yard work recently. No acute event. Has strained lumbar spine in the past, this feels similar.   Otherwise no concerns, feeling well.   Past Medical History:  Diagnosis Date  . Allergy   . Anxiety   . Colon polyps   . Enlarged prostate   . Esophageal stricture   . GERD (gastroesophageal reflux disease)   . Hyperlipidemia    caused by a medication he was taking - labs after med d/c'd were normal  . Legally blind in left eye, as defined in [place]    at age 58, from infection, has left eye prosthesis  . Osteoarthritis   . Pneumonia   . Rheumatoid arthritis ([place])    Dr Lincoln Maxin, [facility]    Past Surgical History:  Procedure Laterality Date  . COLONOSCOPY  2014  . dental implants    . ESOPHAGEAL MANOMETRY N/A 03/10/2018   Procedure: ESOPHAGEAL MANOMETRY (EM);  Surgeon: Mauri Pole, MD;  Location: [facility];  Service: Endoscopy;  Laterality: N/A;  . ESOPHAGOGASTRODUODENOSCOPY     around 2010  . EYE SURGERY     parasitic infection left eye  . FRACTURE SURGERY    . [place] IMPEDANCE STUDY N/A 03/10/2018   Procedure: [place] IMPEDANCE STUDY;  Surgeon: Mauri Pole, MD;   Location: [facility];  Service: Endoscopy;  Laterality: N/A;  . POLYPECTOMY    . SHOULDER ARTHROSCOPY WITH SUBACROMIAL DECOMPRESSION AND OPEN ROTATOR C Left 11/09/2013   Procedure: LEFT SHOULDER ARTHROSCOPY WITH SUBACROMIAL DECOMPRESSION AND OPEN BICEP TENODESIS, OPEN DISTAL CLAVICLE RESECTION ,open rotator cuff repair ,Debridement slap tear ;  Surgeon: Augustin Schooling, MD;  Location: [facility];  Service: Orthopedics;  Laterality: Left;  . TONSILLECTOMY    . UPPER GASTROINTESTINAL ENDOSCOPY      Family History  Problem Relation Age of Onset  . Breast cancer Mother 64  . Colon polyps Brother   . Colon cancer Neg Hx   . Esophageal cancer Neg Hx   . Rectal cancer Neg Hx   . Stomach cancer Neg Hx     Social History   Socioeconomic History  . Marital status: Married    Spouse name: Not on file  . Number of children: 2  . Years of education: Not on file  . Highest education level: Not on file  Occupational History  . Occupation: Retired   Tobacco Use  . Smoking status: Former Research scientist (life sciences)  . Smokeless tobacco: Never Used  . Tobacco comment: quit 30 years ago   Vaping Use  . Vaping Use: Never  used  Substance and Sexual Activity  . Alcohol use: Yes    Comment: social drinker  . Drug use: No  . Sexual activity: Yes  Other Topics Concern  . Not on file  Social History Narrative  . Not on file   Social Determinants of Health   Financial Resource Strain:   . Difficulty of Paying Living Expenses:   Food Insecurity:   . Worried About Charity fundraiser in the Last Year:   . Arboriculturist in the Last Year:   Transportation Needs:   . Film/video editor (Medical):   Marland Kitchen Lack of Transportation (Non-Medical):   Physical Activity:   . Days of Exercise per Week:   . Minutes of Exercise per Session:   Stress:   . Feeling of Stress :   Social Connections:   . Frequency of Communication with Friends and Family:   . Frequency of Social Gatherings with Friends and Family:   .  Attends Religious Services:   . Active Member of Clubs or Organizations:   . Attends Archivist Meetings:   Marland Kitchen Marital Status:   Intimate Partner Violence:   . Fear of Current or Ex-Partner:   . Emotionally Abused:   Marland Kitchen Physically Abused:   . Sexually Abused:     Outpatient Medications Prior to Visit  Medication Sig Dispense Refill  . albuterol (PROVENTIL HFA;VENTOLIN HFA) 108 (90 Base) MCG/ACT inhaler Inhale 2 puffs into the lungs every 6 (six) hours as needed for wheezing or shortness of breath. 1 Inhaler 0  . ALPRAZolam (XANAX) 0.5 MG tablet TAKE 1 TABLET BY MOUTH EVERY 6 HOURS AS NEEDED FOR ANXIETY 120 tablet 5  . b complex vitamins capsule Take 1 capsule by mouth daily.    Marland Kitchen CALCIUM-MAGNESIUM-ZINC PO Take 1 tablet by mouth daily.    . Cholecalciferol (VITAMIN D3) 2000 UNITS capsule Take 2,000 Units by mouth daily.    . diclofenac Sodium (VOLTAREN) 1 % GEL     . Diclofenac Sodium 1 % CREA Place onto the skin as needed.    . Dietary Management Product (RHEUMATE) CAPS Take 1 capsule by mouth daily.    Marland Kitchen erythromycin ophthalmic ointment Place 1 application into the left eye at bedtime. 3.5 g 1  . etanercept (ENBREL SURECLICK) 50 MG/ML injection Inject 0.98 mLs (50 mg total) into the skin once a week. Inject 1 syringe SQ once a week 4 Syringe 3  . folic acid (FOLVITE) 1 MG tablet Take 1 mg by mouth daily.    . Melatonin 3 MG TBDP Take 3 mg by mouth at bedtime.    . METHOTREXATE SODIUM IJ Inject 60 mg into the muscle once a week.     Marland Kitchen omeprazole (PRILOSEC) 20 MG capsule TAKE 1 CAPSULE BY MOUTH TWICE DAILY BEFORE MEALS 180 capsule 3  . rosuvastatin (CRESTOR) 10 MG tablet Take 1 tablet (10 mg total) by mouth at bedtime. 90 tablet 3  . sertraline (ZOLOFT) 100 MG tablet TAKE 1 TABLET BY MOUTH ONCE DAILY 90 tablet 1  . tadalafil (CIALIS) 5 MG tablet TAKE 1 TABLET BY MOUTH AT BEDTIME 90 tablet 1  . VITAMIN E PO Take 1 tablet by mouth daily.     Facility-Administered Medications  Prior to Visit  Medication Dose Route Frequency Provider Last Rate Last Admin  . 0.9 %  sodium chloride infusion  500 mL Intravenous Continuous Cirigliano, Vito V, DO        No Known Allergies  Review of Systems Per hpi     Objective:    Physical Exam Vitals and nursing note reviewed.  Musculoskeletal:        General: Tenderness (lumbar paraspinal) present. No swelling, deformity or signs of injury. Normal range of motion.     Right lower leg: No edema.     Left lower leg: No edema.  Skin:    General: Skin is warm and dry.  Neurological:     General: No focal deficit present.     Mental Status: He is oriented to person, place, and time. Mental status is at baseline.  Psychiatric:        Mood and Affect: Mood normal.        Behavior: Behavior normal.        Thought Content: Thought content normal.        Judgment: Judgment normal.     BP 132/87   Pulse 82   Temp 97.8 F (36.6 C) (Temporal)   Resp 17   Ht 5\' 8"  (1.727 m)   Wt 193 lb 6.4 oz (87.7 kg)   SpO2 96%   BMI 29.41 kg/m  Wt Readings from Last 3 Encounters:  07/21/19 193 lb 6.4 oz (87.7 kg)  04/08/19 192 lb 8 oz (87.3 kg)  04/03/19 190 lb (86.2 kg)    Health Maintenance Due  Topic Date Due  . TETANUS/TDAP  01/23/2018    There are no preventive care reminders to display for this patient.   Lab Results  Component Value Date   TSH 1.160 04/11/2018   Lab Results  Component Value Date   WBC 5.0 04/04/2017   HGB 13.6 04/04/2017   HCT 40.1 04/04/2017   MCV 87 04/04/2017   PLT 283 04/04/2017   Lab Results  Component Value Date   NA 137 10/13/2018   K 4.6 10/13/2018   CO2 24 10/13/2018   GLUCOSE 104 (H) 10/13/2018   BUN 19 10/13/2018   CREATININE 0.87 10/13/2018   BILITOT 0.4 10/13/2018   ALKPHOS 49 10/13/2018   AST 26 10/13/2018   ALT 17 10/13/2018   PROT 6.9 10/13/2018   ALBUMIN 4.5 10/13/2018   CALCIUM 9.5 10/13/2018   ANIONGAP 10 11/12/2013   Lab Results  Component Value Date   CHOL  240 (H) 10/13/2018   Lab Results  Component Value Date   HDL 45 10/13/2018   Lab Results  Component Value Date   LDLCALC 179 (H) 10/13/2018   Lab Results  Component Value Date   TRIG 93 10/13/2018   Lab Results  Component Value Date   CHOLHDL 5.3 (H) 10/13/2018   No results found for: HGBA1C     Assessment & Plan:   Problem List Items Addressed This Visit    None    Visit Diagnoses    Need for prophylactic vaccination with combined diphtheria-tetanus-pertussis (DTP) vaccine    -  Primary   Strain of lumbar paraspinous muscle, initial encounter       Relevant Medications   methocarbamol (ROBAXIN) 500 MG tablet       Meds ordered this encounter  Medications  . methocarbamol (ROBAXIN) 500 MG tablet    Sig: Take 1 tablet (500 mg total) by mouth 4 (four) times daily.    Dispense:  60 tablet    Refill:  0    Order Specific Question:   Supervising Provider    AnswerRutherford Guys [[id number]]   PLAN  Robaxin 500mg  PO qid PRN  Continue yoga and stretching at home  Return prn, or call to request PT referral  Patient encouraged to call clinic with any questions, comments, or concerns.  Maximiano Coss, NP

## 2019-07-21 NOTE — Patient Instructions (Signed)
° ° ° °  If you have lab work done today you will be contacted with your lab results within the next 2 weeks.  If you have not heard from us then please contact us. The fastest way to get your results is to register for My Chart. ° ° °IF you received an x-ray today, you will receive an invoice from [facility]. Please contact [facility] at [phone] with questions or concerns regarding your invoice.  ° °IF you received labwork today, you will receive an invoice from [facility]. Please contact [facility] at [phone] with questions or concerns regarding your invoice.  ° °Our billing staff will not be able to assist you with questions regarding bills from these companies. ° °You will be contacted with the lab results as soon as they are available. The fastest way to get your results is to activate your My Chart account. Instructions are located on the last page of this paperwork. If you have not heard from us regarding the results in 2 weeks, please contact this office. °  ° ° ° °

## 2019-07-22 ENCOUNTER — Ambulatory Visit (INDEPENDENT_AMBULATORY_CARE_PROVIDER_SITE_OTHER): Payer: Medicare Other | Admitting: Emergency Medicine

## 2019-07-22 VITALS — BP 132/87 | Ht 68.0 in | Wt 193.0 lb

## 2019-07-22 DIAGNOSIS — Z0000 Encounter for general adult medical examination without abnormal findings: Secondary | ICD-10-CM

## 2019-07-22 NOTE — Progress Notes (Signed)
Presents today for [employer] Visit   Date of last exam: 07/21/2019  Interpreter used for this visit? No  I connected with  Grant Hunter on 07/22/19 by a telephone  telemedicine  that I am speaking with the correct person using two identifiers.   I discussed the limitations of evaluation and management by telemedicine. The patient expressed understanding and agreed to proceed.   Patient Care Team: Rutherford Guys, MD as PCP - General (Family Medicine)   Other items to address today:   Discussed immunizations Discussed Eye/Dental   Other Screening: Last screening for diabetes:10/13/2018  Last lipid screening: 10/13/2018  ADVANCE DIRECTIVES: Discussed: yes On File: no Materials Provided: yes  Immunization status:  Immunization History  Administered Date(s) Administered  . Fluad Quad(high Dose 65+) 10/13/2018  . Hep B / HiB 08/31/2005  . Hepatitis A 08/31/2005  . Influenza,inj,Quad PF,6+ Mos 09/24/2014  . Influenza-Unspecified 12/21/2015, 11/20/2016  . Pneumococcal Conjugate-13 10/13/2018  . Td 01/24/2008  . Zoster 10/21/2013     Health Maintenance Due  Topic Date Due  . Samul Dada  01/23/2018     Functional Status Survey: Is the patient deaf or have difficulty hearing?: No Does the patient have difficulty seeing, even when wearing glasses/contacts?: No Does the patient have difficulty concentrating, remembering, or making decisions?: No Does the patient have difficulty walking or climbing stairs?: No Does the patient have difficulty dressing or bathing?: No Does the patient have difficulty doing errands alone such as visiting a doctor's office or shopping?: No   6CIT Screen 07/22/2019  What Year? 0 points  What month? 0 points  What time? 0 points  Count back from 20 0 points  Months in reverse 0 points  Repeat phrase 0 points  Total Score 0        Clinical Support from 07/22/2019 in [facility]  AUDIT-C Score 7         Home Environment:   No trouble climbing stairs Lives two story home Yes scattered rugs with grabbers Yes grab bars Adequate lighting/ no clutter   Patient Active Problem List   Diagnosis Date Noted  . Gastroesophageal reflux disease   . Prosthetic eye globe 07/17/2017  . Generalized anxiety disorder 04/04/2017  . Hyperlipidemia 04/04/2017  . Benign prostatic hyperplasia 04/04/2017  . Rheumatoid arthritis ([place]) 04/04/2017  . Other male erectile dysfunction 03/16/2016  . Impaired fasting glucose 12/21/2015  . Dermatochalasis of both upper eyelids 10/08/2013  . History of colon polyps 10/17/2012     Past Medical History:  Diagnosis Date  . Allergy   . Anxiety   . Colon polyps   . Enlarged prostate   . Esophageal stricture   . GERD (gastroesophageal reflux disease)   . Hyperlipidemia    caused by a medication he was taking - labs after med d/c'd were normal  . Legally blind in left eye, as defined in [place]    at age 17, from infection, has left eye prosthesis  . Osteoarthritis   . Pneumonia   . Rheumatoid arthritis ([place])    Dr Lincoln Maxin, [facility]     Past Surgical History:  Procedure Laterality Date  . COLONOSCOPY  2014  . dental implants    . ESOPHAGEAL MANOMETRY N/A 03/10/2018   Procedure: ESOPHAGEAL MANOMETRY (EM);  Surgeon: Mauri Pole, MD;  [facility];  Service: Endoscopy;  Laterality: N/A;  . ESOPHAGOGASTRODUODENOSCOPY     around 2010  . EYE SURGERY  parasitic infection left eye  . FRACTURE SURGERY    . [place] IMPEDANCE STUDY N/A 03/10/2018   Procedure: [place] IMPEDANCE STUDY;  Surgeon: Mauri Pole, MD;  [facility];  Service: Endoscopy;  Laterality: N/A;  . POLYPECTOMY    . SHOULDER ARTHROSCOPY WITH SUBACROMIAL DECOMPRESSION AND OPEN ROTATOR C Left 11/09/2013   Procedure: LEFT SHOULDER ARTHROSCOPY WITH SUBACROMIAL DECOMPRESSION AND OPEN BICEP TENODESIS, OPEN DISTAL CLAVICLE RESECTION ,open rotator cuff repair  ,Debridement slap tear ;  Surgeon: Augustin Schooling, MD;  Location: [facility];  Service: Orthopedics;  Laterality: Left;  . TONSILLECTOMY    . UPPER GASTROINTESTINAL ENDOSCOPY       Family History  Problem Relation Age of Onset  . Breast cancer Mother 47  . Colon polyps Brother   . Colon cancer Neg Hx   . Esophageal cancer Neg Hx   . Rectal cancer Neg Hx   . Stomach cancer Neg Hx      Social History   Socioeconomic History  . Marital status: Married    Spouse name: Not on file  . Number of children: 2  . Years of education: Not on file  . Highest education level: Not on file  Occupational History  . Occupation: Retired   Tobacco Use  . Smoking status: Former Research scientist (life sciences)  . Smokeless tobacco: Never Used  . Tobacco comment: quit 30 years ago   Vaping Use  . Vaping Use: Never used  Substance and Sexual Activity  . Alcohol use: Yes    Comment: social drinker  . Drug use: No  . Sexual activity: Yes  Other Topics Concern  . Not on file  Social History Narrative  . Not on file   Social Determinants of Health   Financial Resource Strain:   . Difficulty of Paying Living Expenses:   Food Insecurity:   . Worried About Charity fundraiser in the Last Year:   . Arboriculturist in the Last Year:   Transportation Needs:   . Film/video editor (Medical):   Marland Kitchen Lack of Transportation (Non-Medical):   Physical Activity:   . Days of Exercise per Week:   . Minutes of Exercise per Session:   Stress:   . Feeling of Stress :   Social Connections:   . Frequency of Communication with Friends and Family:   . Frequency of Social Gatherings with Friends and Family:   . Attends Religious Services:   . Active Member of Clubs or Organizations:   . Attends Archivist Meetings:   Marland Kitchen Marital Status:   Intimate Partner Violence:   . Fear of Current or Ex-Partner:   . Emotionally Abused:   Marland Kitchen Physically Abused:   . Sexually Abused:      No Known Allergies   Prior to Admission  medications   Medication Sig Start Date End Date Taking? Authorizing Provider  albuterol (PROVENTIL HFA;VENTOLIN HFA) 108 (90 Base) MCG/ACT inhaler Inhale 2 puffs into the lungs every 6 (six) hours as needed for wheezing or shortness of breath. 03/22/17  Yes Rutherford Guys, MD  ALPRAZolam [name]) 0.5 MG tablet TAKE 1 TABLET BY MOUTH EVERY 6 HOURS AS NEEDED FOR ANXIETY 04/27/19  Yes Rutherford Guys, MD  b complex vitamins capsule Take 1 capsule by mouth daily.   Yes [provider]  CALCIUM-MAGNESIUM-ZINC PO Take 1 tablet by mouth daily.   Yes [provider]  Cholecalciferol (VITAMIN D3) 2000 UNITS capsule Take 2,000 Units by mouth daily.  Yes [provider]  diclofenac Sodium (VOLTAREN) 1 % GEL  02/13/19  Yes [provider]  Diclofenac Sodium 1 % CREA Place onto the skin as needed.   Yes [provider]  Dietary Management Product (RHEUMATE) CAPS Take 1 capsule by mouth daily. 01/02/19  Yes [provider]  erythromycin ophthalmic ointment Place 1 application into the left eye at bedtime. 04/11/18  Yes Rutherford Guys, MD  etanercept (ENBREL SURECLICK) 50 MG/ML injection Inject 0.98 mLs (50 mg total) into the skin once a week. Inject 1 syringe SQ once a week 10/25/17  Yes Jegede, Olugbemiga E, MD  folic acid (FOLVITE) 1 MG tablet Take 1 mg by mouth daily.   Yes [provider]  Melatonin 3 MG TBDP Take 3 mg by mouth at bedtime.   Yes [provider]  methocarbamol (ROBAXIN) 500 MG tablet Take 1 tablet (500 mg total) by mouth 4 (four) times daily. 07/21/19  Yes Maximiano Coss, NP  METHOTREXATE SODIUM IJ Inject 60 mg into the muscle once a week.    Yes [provider]  omeprazole (PRILOSEC) 20 MG capsule TAKE 1 CAPSULE BY MOUTH TWICE DAILY BEFORE MEALS 03/26/19  Yes Rutherford Guys, MD  rosuvastatin (CRESTOR) 10 MG tablet Take 1 tablet (10 mg total) by mouth at bedtime. 10/23/18  Yes Rutherford Guys, MD  sertraline  (ZOLOFT) 100 MG tablet TAKE 1 TABLET BY MOUTH ONCE DAILY 03/26/19  Yes Rutherford Guys, MD  tadalafil (CIALIS) 5 MG tablet TAKE 1 TABLET BY MOUTH AT BEDTIME 03/26/19  Yes Rutherford Guys, MD  VITAMIN E PO Take 1 tablet by mouth daily.   Yes [provider]     Depression screen [facility] 2/9 07/22/2019 07/21/2019 04/24/2018 04/11/2018 10/17/2017  Decreased Interest 0 0 0 0 0  Down, Depressed, Hopeless 0 0 0 0 0  PHQ - 2 Score 0 0 0 0 0  Altered sleeping - - - 0 -  Tired, decreased energy - - - 0 -  Change in appetite - - - 0 -  Feeling bad or failure about yourself  - - - 0 -  Trouble concentrating - - - 0 -  Moving slowly or fidgety/restless - - - 0 -  Suicidal thoughts - - - 0 -  PHQ-9 Score - - - 0 -  Difficult doing work/chores - - - Not difficult at all -     Fall Risk  07/22/2019 07/21/2019 10/13/2018 04/24/2018 04/11/2018  Falls in the past year? 0 0 0 0 0  Number falls in past yr: 0 0 0 - -  Injury with Fall? 0 0 0 - 0  Follow up Falls evaluation completed;Education provided Falls evaluation completed Falls evaluation completed - -      PHYSICAL EXAM: BP 132/87 Comment: taken from a previous visit  Ht 5\' 8"  (1.727 m)   Wt 193 lb (87.5 kg)   BMI 29.35 kg/m    Wt Readings from Last 3 Encounters:  07/22/19 193 lb (87.5 kg)  07/21/19 193 lb 6.4 oz (87.7 kg)  04/08/19 192 lb 8 oz (87.3 kg)      Education/Counseling provided regarding diet and exercise, prevention of chronic diseases, smoking/tobacco cessation, if applicable, and reviewed "Covered Medicare Preventive Services."

## 2019-07-22 NOTE — Patient Instructions (Signed)
Thank you for taking time to come for your Medicare Wellness Visit. I appreciate your ongoing commitment to your health goals. Please review the following plan we discussed and let me know if I can assist you in the future.  Leroy Kennedy LPN  Preventive Care 64 Years and Older, Male Preventive care refers to lifestyle choices and visits with your health care provider that can promote health and wellness. This includes:  A yearly physical exam. This is also called an annual well check.  Regular dental and eye exams.  Immunizations.  Screening for certain conditions.  Healthy lifestyle choices, such as diet and exercise. What can I expect for my preventive care visit? Physical exam Your health care provider will check:  Height and weight. These may be used to calculate body mass index (BMI), which is a measurement that tells if you are at a healthy weight.  Heart rate and blood pressure.  Your skin for abnormal spots. Counseling Your health care provider may ask you questions about:  Alcohol, tobacco, and drug use.  Emotional well-being.  Home and relationship well-being.  Sexual activity.  Eating habits.  History of falls.  Memory and ability to understand (cognition).  Work and work Statistician. What immunizations do I need?  Influenza (flu) vaccine  This is recommended every year. Tetanus, diphtheria, and pertussis (Tdap) vaccine  You may need a Td booster every 10 years. Varicella (chickenpox) vaccine  You may need this vaccine if you have not already been vaccinated. Zoster (shingles) vaccine  You may need this after age 66. Pneumococcal conjugate (PCV13) vaccine  One dose is recommended after age 84. Pneumococcal polysaccharide (PPSV23) vaccine  One dose is recommended after age 88. Measles, mumps, and rubella (MMR) vaccine  You may need at least one dose of MMR if you were born in 1957 or later. You may also need a second dose. Meningococcal  conjugate (MenACWY) vaccine  You may need this if you have certain conditions. Hepatitis A vaccine  You may need this if you have certain conditions or if you travel or work in places where you may be exposed to hepatitis A. Hepatitis B vaccine  You may need this if you have certain conditions or if you travel or work in places where you may be exposed to hepatitis B. Haemophilus influenzae type b (Hib) vaccine  You may need this if you have certain conditions. You may receive vaccines as individual doses or as more than one vaccine together in one shot (combination vaccines). Talk with your health care provider about the risks and benefits of combination vaccines. What tests do I need? Blood tests  Lipid and cholesterol levels. These may be checked every 5 years, or more frequently depending on your overall health.  Hepatitis C test.  Hepatitis B test. Screening  Lung cancer screening. You may have this screening every year starting at age 24 if you have a 30-pack-year history of smoking and currently smoke or have quit within the past 15 years.  Colorectal cancer screening. All adults should have this screening starting at age 52 and continuing until age 61. Your health care provider may recommend screening at age 57 if you are at increased risk. You will have tests every 1-10 years, depending on your results and the type of screening test.  Prostate cancer screening. Recommendations will vary depending on your family history and other risks.  Diabetes screening. This is done by checking your blood sugar (glucose) after you have not eaten for  a while (fasting). You may have this done every 1-3 years.  Abdominal aortic aneurysm (AAA) screening. You may need this if you are a current or former smoker.  Sexually transmitted disease (STD) testing. Follow these instructions at home: Eating and drinking  Eat a diet that includes fresh fruits and vegetables, whole grains, lean  protein, and low-fat dairy products. Limit your intake of foods with high amounts of sugar, saturated fats, and salt.  Take vitamin and mineral supplements as recommended by your health care provider.  Do not drink alcohol if your health care provider tells you not to drink.  If you drink alcohol: ? Limit how much you have to 0-2 drinks a day. ? Be aware of how much alcohol is in your drink. In the [place] one drink equals one 12 oz bottle of beer (355 mL), one 5 oz glass of wine (148 mL), or one 1 oz glass of hard liquor (44 mL). Lifestyle  Take daily care of your teeth and gums.  Stay active. Exercise for at least 30 minutes on 5 or more days each week.  Do not use any products that contain nicotine or tobacco, such as cigarettes, e-cigarettes, and chewing tobacco. If you need help quitting, ask your health care provider.  If you are sexually active, practice safe sex. Use a condom or other form of protection to prevent STIs (sexually transmitted infections).  Talk with your health care provider about taking a low-dose aspirin or statin. What's next?  Visit your health care provider once a year for a well check visit.  Ask your health care provider how often you should have your eyes and teeth checked.  Stay up to date on all vaccines. This information is not intended to replace advice given to you by your health care provider. Make sure you discuss any questions you have with your health care provider. Document Revised: 01/02/2018 Document Reviewed: 01/02/2018 Elsevier Patient Education  2020 [employer]  

## 2019-07-23 MED FILL — ROSUVASTATIN CALCIUM 10 MG: 10 | 90 days supply | Qty: 90 | Fill #3

## 2019-08-06 IMAGING — MG DIGITAL DIAGNOSTIC BILATERAL MAMMOGRAM WITH TOMO AND CAD
6 of 10 series · 6 of 30 positions shown · non-contrast
Comparison: None.

ACR Breast Density Category a: The breast tissue is almost entirely
fatty.

CLINICAL DATA: Male patient describes RIGHT breast lump, 9 o'clock
axis region, with associated tenderness for 2 weeks. Family history
of breast cancer.

EXAM:
DIGITAL DIAGNOSTIC BILATERAL MAMMOGRAM WITH CAD AND TOMO
ULTRASOUND RIGHT BREAST

[L MLO synth-2D]
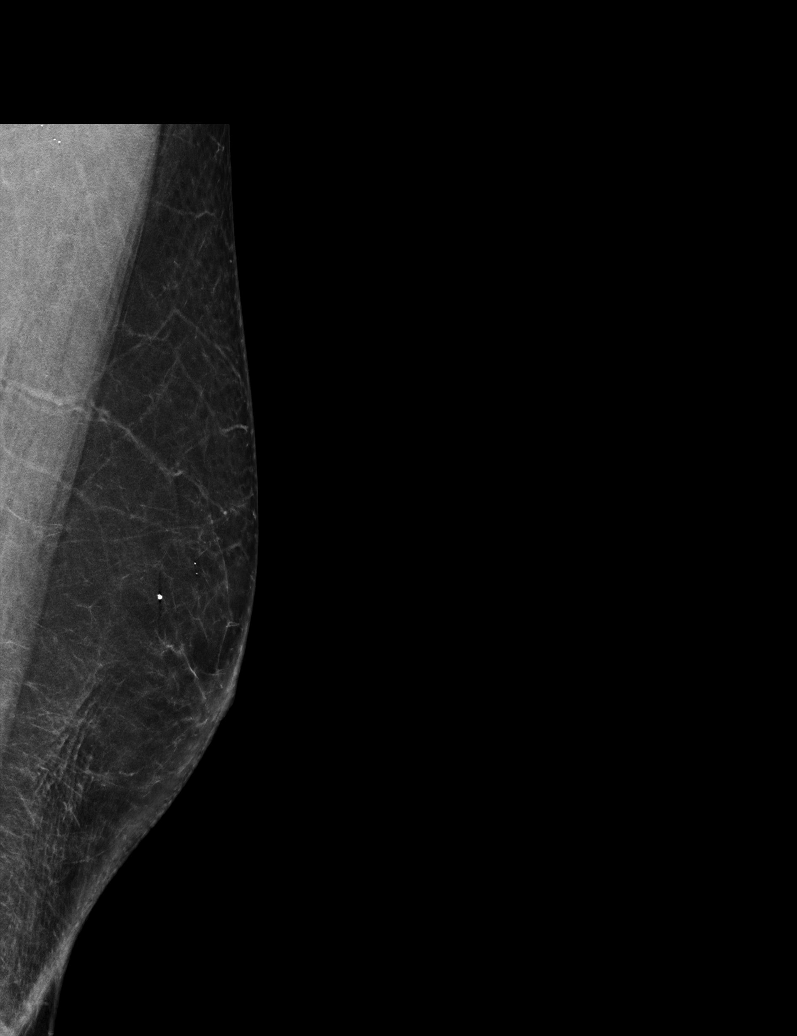

[R CC synth-2D (1 of 2)]
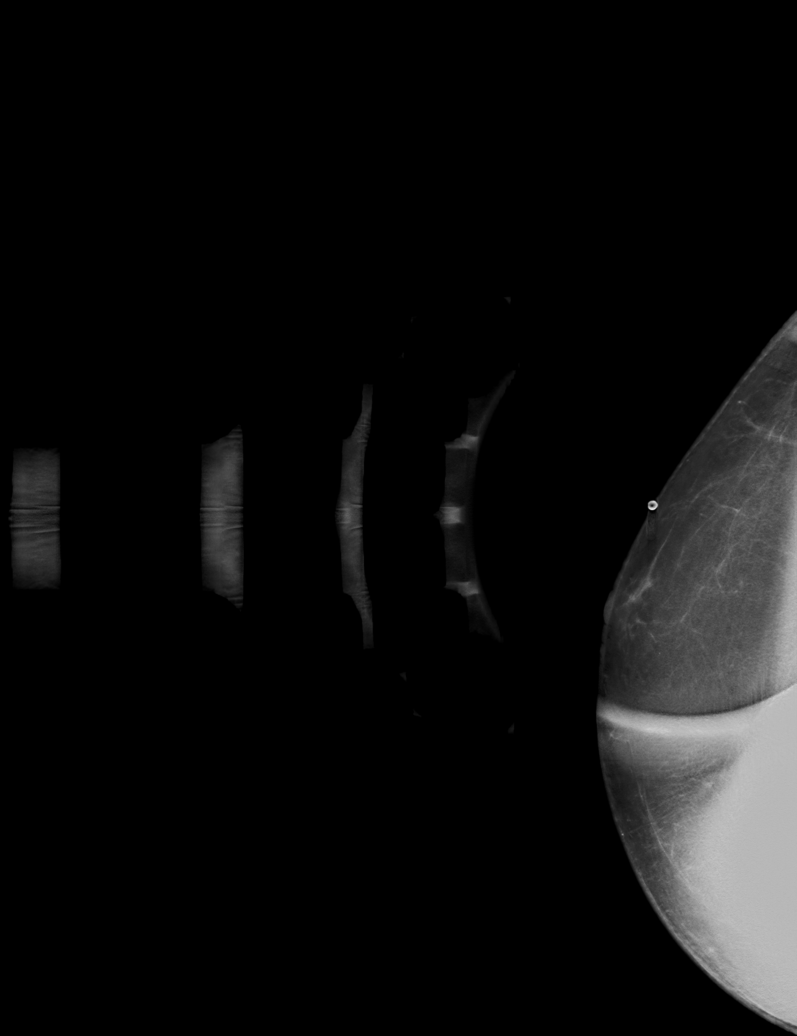

[R MLO synth-2D]
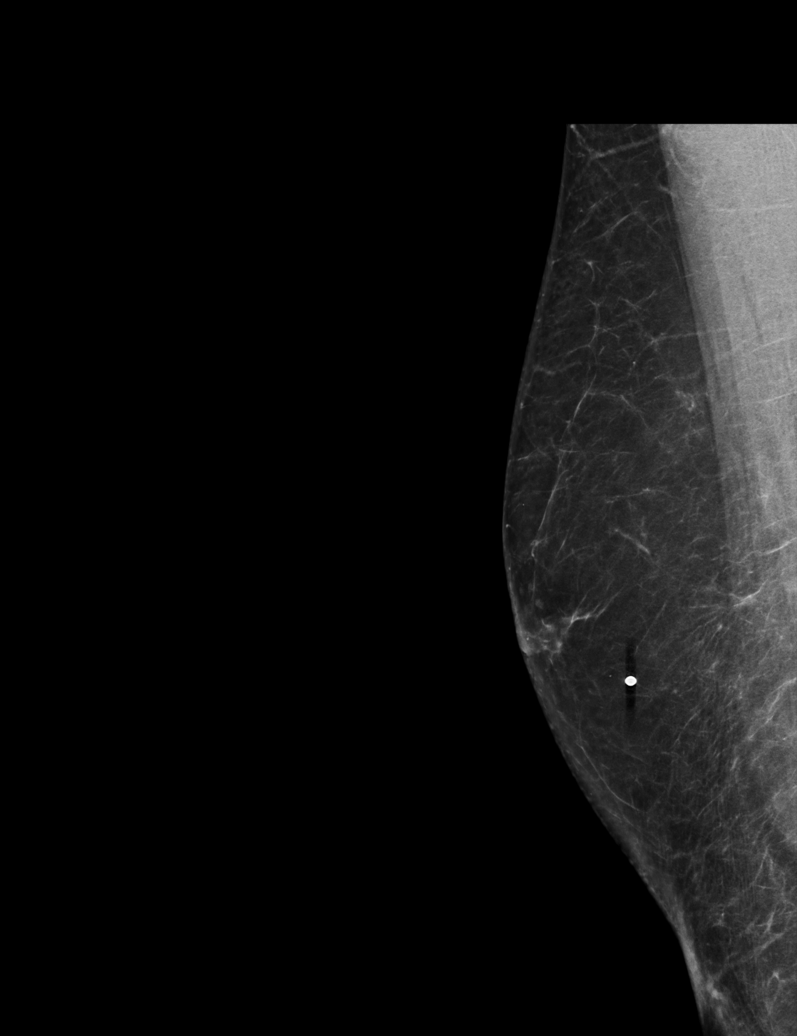

[R CC synth-2D (2 of 2)]
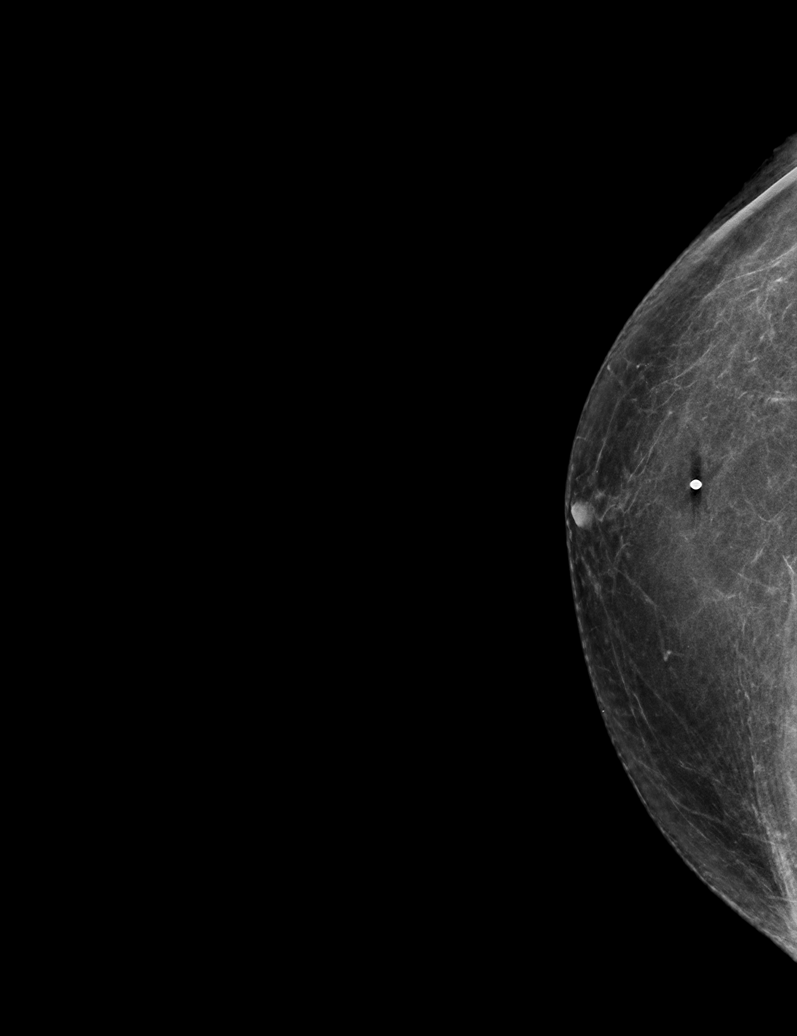

[L CC synth-2D]
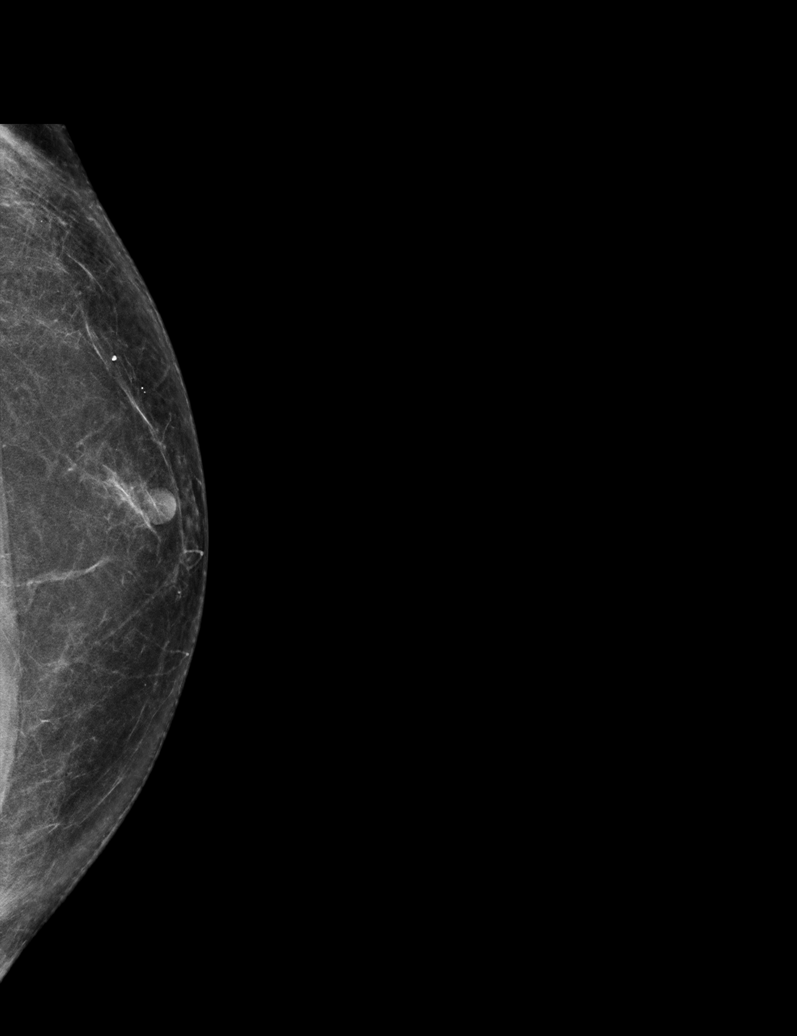

[L MLO tomo · tomo slice 35/68.0]
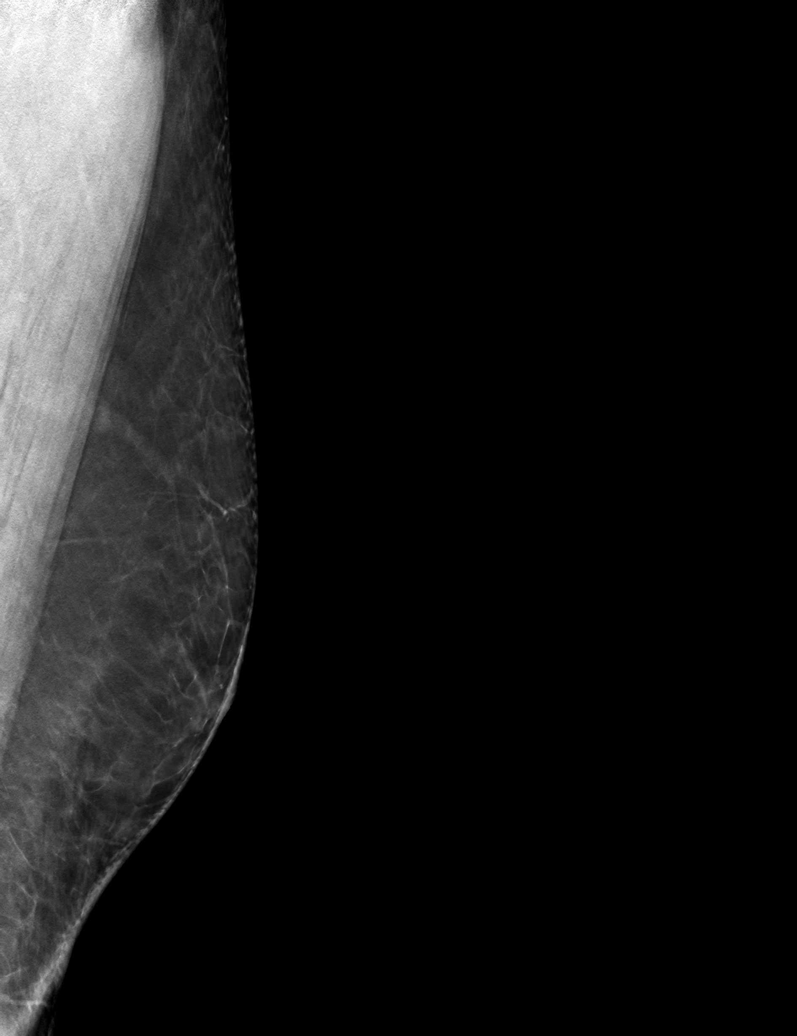

[6 of 30 positions shown; findings below may reference images not displayed]

FINDINGS: There are no suspicious masses, suspicious calcifications or
secondary signs of malignancy within either breast. Specifically,
there is no mammographic abnormality within the RIGHT breast
corresponding to the area of clinical concern, with overlying skin
marker in place.

Mammographic images were processed with CAD.

Targeted ultrasound is performed, evaluating the outer and
retroareolar RIGHT breast with particular attention to the 7-9
o'clock axis as directed by the patient, showing only normal soft
tissues throughout. There are mildly prominent benign fat lobules
within the superficial RIGHT breast, 7 o'clock axis, a possible
correlate for the clinical findings. No solid or cystic mass.
IMPRESSION: 1. No evidence of malignancy within either breast. Specifically, no
evidence of malignancy within the RIGHT breast at the 7-9 o'clock
axes, corresponding to the area of clinical concern.
2. Mildly prominent benign fat lobules within the RIGHT breast at
the 7 o'clock axis, at superficial depth, corresponding to the area
of clinical concern, possible correlate for the clinical findings.

RECOMMENDATION:
1. Clinical follow-up. Patient was instructed to follow-up with his
primary care physician if the breast/chest tenderness worsened as
this could necessitate additional imaging of the deeper chest wall
structures or underlying ribs.
2. The patient was instructed to return to the [REDACTED] for
additional imaging if the area that he feels becomes larger and/or
firmer to palpation, or if a new palpable abnormality is identified
in either breast.

I have discussed the findings and recommendations with the patient.
Results were also provided in writing at the conclusion of the
visit. If applicable, a reminder letter will be sent to the patient
regarding the next appointment.

BI-RADS CATEGORY  2: Benign.

## 2019-08-21 MED FILL — ROSUVASTATIN CALCIUM 10 MG: 10 | 90 days supply | Qty: 90 | Fill #3

## 2019-08-21 MED FILL — ALPRAZolam 0.5 MG TABS: 0.5 | 30 days supply | Qty: 120 | Fill #4

## 2019-08-24 ENCOUNTER — Other Ambulatory Visit (HOSPITAL_COMMUNITY): Payer: Self-pay | Admitting: Physician Assistant

## 2019-08-24 MED FILL — DICLOFENAC SODIUM 1 % GEL: 1 | 30 days supply | Qty: 200 | Fill #0

## 2019-09-03 DIAGNOSIS — Z79899 Other long term (current) drug therapy: Secondary | ICD-10-CM | POA: Diagnosis not present

## 2019-09-03 DIAGNOSIS — M150 Primary generalized (osteo)arthritis: Secondary | ICD-10-CM | POA: Diagnosis not present

## 2019-09-03 DIAGNOSIS — M25511 Pain in right shoulder: Secondary | ICD-10-CM | POA: Diagnosis not present

## 2019-09-03 DIAGNOSIS — M0579 Rheumatoid arthritis with rheumatoid factor of multiple sites without organ or systems involvement: Secondary | ICD-10-CM | POA: Diagnosis not present

## 2019-09-03 DIAGNOSIS — M1711 Unilateral primary osteoarthritis, right knee: Secondary | ICD-10-CM | POA: Diagnosis not present

## 2019-09-03 DIAGNOSIS — M79641 Pain in right hand: Secondary | ICD-10-CM | POA: Diagnosis not present

## 2019-09-23 ENCOUNTER — Other Ambulatory Visit: Payer: Self-pay | Admitting: Family Medicine

## 2019-09-23 ENCOUNTER — Other Ambulatory Visit (HOSPITAL_COMMUNITY): Payer: Self-pay | Admitting: Internal Medicine

## 2019-09-23 MED FILL — ALPRAZolam 0.5 MG TABS: 0.5 | 30 days supply | Qty: 120 | Fill #5

## 2019-09-23 MED FILL — TADALAFIL 5 MG TABS: 5 | 90 days supply | Qty: 90 | Fill #0

## 2019-09-23 MED FILL — SERTRALINE HCL 100 MG TABS: 100 | 90 days supply | Qty: 90 | Fill #0

## 2019-09-23 MED FILL — METHOTREXATE SODIUM (PF) 50: 50 | 14 days supply | Qty: 4 | Fill #0

## 2019-09-23 MED FILL — OMEPRAZOLE 20 MG CAP: 20 | 90 days supply | Qty: 180 | Fill #2

## 2019-10-12 MED FILL — METHOTREXATE 50 MG/2 ML VIA: 50 | 14 days supply | Qty: 4 | Fill #1

## 2019-10-28 ENCOUNTER — Other Ambulatory Visit: Payer: Self-pay | Admitting: Family Medicine

## 2019-10-28 MED FILL — METHOTREXATE 50 MG/2 ML VIA: 50 | 14 days supply | Qty: 4 | Fill #1

## 2019-10-28 NOTE — Telephone Encounter (Signed)
pmp reviewd, appropriate meds refilled 

## 2019-10-28 NOTE — Telephone Encounter (Signed)
Patient is requesting a refill of the following medications: Requested Prescriptions   Pending Prescriptions Disp Refills  . ALPRAZolam (XANAX) 0.5 MG tablet [Pharmacy Med Name: ALPRAZolam 0.5 MG TABS 0.5 Tablet] 120 tablet 5    Sig: TAKE 1 TABLET BY MOUTH EVERY 6 HOURS AS NEEDED FOR ANXIETY  . rosuvastatin (CRESTOR) 10 MG tablet [Pharmacy Med Name: ROSUVASTATIN CALCIUM 10 MG 10 Tablet] 90 tablet 3    Sig: TAKE 1 TABLET (10 MG TOTAL) BY MOUTH AT BEDTIME.    Date of patient request: 10/28/19 Last office visit: 07/22/19--wellness visit with Sagardia Date of last refill: (04/27/19-- Xanax) & (10/23/18--Crestor)  Last refill amount: Xanax-120 + 5 & Crestor- 1 yr supply Follow up time period per chart: not scheduled yet

## 2019-10-28 NOTE — Telephone Encounter (Signed)
Requested medication (s) are due for refill today:yes  Requested medication (s) are on the active medication list:yes  Last refill: 04/27/19  #120  5 refills  Future visit scheduled no  Notes to clinic: not delegated     Request for Rosuvastatin 10 mg  expired  Requested Prescriptions  Pending Prescriptions Disp Refills   ALPRAZolam (XANAX) 0.5 MG tablet [Pharmacy Med Name: ALPRAZolam 0.5 MG TABS 0.5 Tablet] 120 tablet 5    Sig: TAKE 1 TABLET BY MOUTH EVERY 6 HOURS AS NEEDED FOR ANXIETY      Not Delegated - Psychiatry:  Anxiolytics/Hypnotics Failed - 10/28/2019 11:35 AM      Failed - This refill cannot be delegated      Failed - Urine Drug Screen completed in last 360 days.      Passed - Valid encounter within last 6 months    Recent Outpatient Visits           3 months ago Medicare annual wellness visit, subsequent   [facility], Ines Bloomer, MD   3 months ago Need for prophylactic vaccination with combined diphtheria-tetanus-pertussis (DTP) vaccine   [facility], Delfino Lovett, NP   1 year ago Generalized anxiety disorder   [facility], Lilia Argue, MD   1 year ago Breast mass in male   [facility], Lilia Argue, MD   1 year ago Annual physical exam   [facility], Lilia Argue, MD                rosuvastatin (CRESTOR) 10 MG tablet [Pharmacy Med Name: ROSUVASTATIN CALCIUM 10 MG 10 Tablet] 90 tablet 3    Sig: Take 1 tablet (10 mg total) by mouth at bedtime.      Cardiovascular:  Antilipid - Statins Failed - 10/28/2019 11:35 AM      Failed - Total Cholesterol in normal range and within 360 days    Cholesterol, Total  Date Value Ref Range Status  10/13/2018 240 (H) 100 - 199 mg/dL Final          Failed - LDL in normal range and within 360 days    LDL Chol Calc (NIH)  Date Value Ref Range Status  10/13/2018 179 (H) 0 - 99 mg/dL Final          Failed - HDL in normal range and within 360  days    HDL  Date Value Ref Range Status  10/13/2018 45 >39 mg/dL Final          Failed - Triglycerides in normal range and within 360 days    Triglycerides  Date Value Ref Range Status  10/13/2018 93 0 - 149 mg/dL Final          Passed - Patient is not pregnant      Passed - Valid encounter within last 12 months    Recent Outpatient Visits           3 months ago Medicare annual wellness visit, subsequent   [facility], Ines Bloomer, MD   3 months ago Need for prophylactic vaccination with combined diphtheria-tetanus-pertussis (DTP) vaccine   [facility], Delfino Lovett, NP   1 year ago Generalized anxiety disorder   [facility], Lilia Argue, MD   1 year ago Breast mass in male   [facility], Lilia Argue, MD   1 year ago Annual physical  exam   [facility], Lilia Argue, MD

## 2019-10-30 ENCOUNTER — Telehealth: Payer: Self-pay | Admitting: Family Medicine

## 2019-10-30 ENCOUNTER — Other Ambulatory Visit (HOSPITAL_COMMUNITY): Payer: Self-pay | Admitting: Family Medicine

## 2019-10-30 MED FILL — ALPRAZolam 0.5 MG TABS: 0.5 | 30 days supply | Qty: 120 | Fill #0

## 2019-10-30 NOTE — Telephone Encounter (Signed)
Error

## 2019-11-26 MED FILL — ALPRAZolam 0.5 MG TABS: 0.5 | 30 days supply | Qty: 120 | Fill #1

## 2019-12-07 DIAGNOSIS — M0579 Rheumatoid arthritis with rheumatoid factor of multiple sites without organ or systems involvement: Secondary | ICD-10-CM | POA: Diagnosis not present

## 2019-12-25 MED FILL — TADALAFIL 5 MG TABS: 5 | 90 days supply | Qty: 90 | Fill #1

## 2019-12-25 MED FILL — DICLOFENAC SODIUM 1 % GEL: 1 | 30 days supply | Qty: 200 | Fill #1

## 2019-12-25 MED FILL — SERTRALINE HCL 100 MG TABS: 100 | 90 days supply | Qty: 90 | Fill #1

## 2019-12-25 MED FILL — OMEPRAZOLE 20 MG CAP: 20 | 90 days supply | Qty: 180 | Fill #3

## 2019-12-25 MED FILL — ALPRAZolam 0.5 MG TABS: 0.5 | 30 days supply | Qty: 120 | Fill #2

## 2019-12-25 MED FILL — METHOTREXATE 25 MG/ML VIAL: 50 | 28 days supply | Qty: 4 | Fill #0

## 2020-01-26 MED FILL — METHOTREXATE 25 MG/ML VIAL: 50 | 28 days supply | Qty: 4 | Fill #1

## 2020-01-26 MED FILL — ALPRAZolam 0.5 MG TABS: 0.5 | 30 days supply | Qty: 120 | Fill #3

## 2020-01-29 ENCOUNTER — Other Ambulatory Visit (HOSPITAL_COMMUNITY): Payer: Self-pay | Admitting: Family Medicine

## 2020-02-10 ENCOUNTER — Other Ambulatory Visit (HOSPITAL_COMMUNITY): Payer: Self-pay | Admitting: Internal Medicine

## 2020-02-10 MED FILL — DICLOFENAC SODIUM 1 % GEL: 1 | 30 days supply | Qty: 200 | Fill #2

## 2020-02-16 ENCOUNTER — Other Ambulatory Visit (HOSPITAL_COMMUNITY): Payer: Self-pay | Admitting: Family Medicine

## 2020-02-16 DIAGNOSIS — Z23 Encounter for immunization: Secondary | ICD-10-CM | POA: Diagnosis not present

## 2020-02-16 DIAGNOSIS — M0579 Rheumatoid arthritis with rheumatoid factor of multiple sites without organ or systems involvement: Secondary | ICD-10-CM | POA: Diagnosis not present

## 2020-02-16 DIAGNOSIS — R030 Elevated blood-pressure reading, without diagnosis of hypertension: Secondary | ICD-10-CM | POA: Diagnosis not present

## 2020-02-16 MED FILL — ROSUVASTATIN CALCIUM 10 MG: 10 | 90 days supply | Qty: 90 | Fill #0

## 2020-02-19 MED FILL — METHOTREXATE 25 MG/ML VIAL: 50 | 46 days supply | Qty: 4 | Fill #0

## 2020-02-25 MED FILL — ALPRAZolam 0.5 MG TABS: 0.5 | 30 days supply | Qty: 120 | Fill #4

## 2020-03-02 ENCOUNTER — Other Ambulatory Visit (HOSPITAL_COMMUNITY): Payer: Self-pay

## 2020-03-02 MED FILL — AMOXICILLIN 500 MG CAPSULE: 500 | 7 days supply | Qty: 21 | Fill #0

## 2020-03-08 ENCOUNTER — Other Ambulatory Visit (HOSPITAL_COMMUNITY): Payer: Self-pay | Admitting: Physician Assistant

## 2020-03-08 DIAGNOSIS — M0579 Rheumatoid arthritis with rheumatoid factor of multiple sites without organ or systems involvement: Secondary | ICD-10-CM | POA: Diagnosis not present

## 2020-03-08 DIAGNOSIS — G894 Chronic pain syndrome: Secondary | ICD-10-CM | POA: Diagnosis not present

## 2020-03-08 DIAGNOSIS — Z79899 Other long term (current) drug therapy: Secondary | ICD-10-CM | POA: Diagnosis not present

## 2020-03-08 DIAGNOSIS — M79641 Pain in right hand: Secondary | ICD-10-CM | POA: Diagnosis not present

## 2020-03-08 DIAGNOSIS — M791 Myalgia, unspecified site: Secondary | ICD-10-CM | POA: Diagnosis not present

## 2020-03-08 DIAGNOSIS — M1711 Unilateral primary osteoarthritis, right knee: Secondary | ICD-10-CM | POA: Diagnosis not present

## 2020-03-08 DIAGNOSIS — M150 Primary generalized (osteo)arthritis: Secondary | ICD-10-CM | POA: Diagnosis not present

## 2020-03-08 DIAGNOSIS — M25511 Pain in right shoulder: Secondary | ICD-10-CM | POA: Diagnosis not present

## 2020-03-08 MED FILL — METHOTREXATE SODIUM (PF) 50: 50 | 21 days supply | Qty: 6 | Fill #0

## 2020-04-01 MED FILL — ALPRAZolam 0.5 MG TABS: 0.5 | 30 days supply | Qty: 120 | Fill #5

## 2020-04-01 MED FILL — DICLOFENAC SODIUM 1 % GEL: 1 | 30 days supply | Qty: 200 | Fill #3

## 2020-04-01 MED FILL — METHOTREXATE SODIUM (PF) 50: 50 | 21 days supply | Qty: 6 | Fill #1

## 2020-04-18 DIAGNOSIS — Z79899 Other long term (current) drug therapy: Secondary | ICD-10-CM | POA: Diagnosis not present

## 2020-04-18 DIAGNOSIS — R7301 Impaired fasting glucose: Secondary | ICD-10-CM | POA: Diagnosis not present

## 2020-04-18 DIAGNOSIS — Z0000 Encounter for general adult medical examination without abnormal findings: Secondary | ICD-10-CM | POA: Diagnosis not present

## 2020-04-18 DIAGNOSIS — E782 Mixed hyperlipidemia: Secondary | ICD-10-CM | POA: Diagnosis not present

## 2020-04-20 ENCOUNTER — Other Ambulatory Visit (HOSPITAL_COMMUNITY): Payer: Self-pay | Admitting: Family Medicine

## 2020-04-20 DIAGNOSIS — Z0000 Encounter for general adult medical examination without abnormal findings: Secondary | ICD-10-CM | POA: Diagnosis not present

## 2020-04-20 MED FILL — TADALAFIL 5 MG TABS: 5 | 30 days supply | Qty: 30 | Fill #0

## 2020-04-20 MED FILL — DOXYCYCLINE HYCLATE 100 MG: 100 | 7 days supply | Qty: 14 | Fill #0

## 2020-04-21 ENCOUNTER — Other Ambulatory Visit (HOSPITAL_COMMUNITY): Payer: Self-pay | Admitting: Family Medicine

## 2020-04-21 MED FILL — SERTRALINE HCL 100 MG TABS: 100 | 90 days supply | Qty: 90 | Fill #0

## 2020-04-21 MED FILL — OMEPRAZOLE 20 MG CAP: 20 | 60 days supply | Qty: 60 | Fill #0

## 2020-05-17 ENCOUNTER — Other Ambulatory Visit: Payer: Self-pay | Admitting: Family Medicine

## 2020-05-17 ENCOUNTER — Other Ambulatory Visit (HOSPITAL_COMMUNITY): Payer: Self-pay

## 2020-05-17 MED FILL — Rosuvastatin Calcium Tab 10 MG: ORAL | 90 days supply | Qty: 90 | Fill #0 | Status: AC

## 2020-05-17 MED FILL — Tadalafil Tab 5 MG: ORAL | 30 days supply | Qty: 30 | Fill #0 | Status: AC

## 2020-05-18 ENCOUNTER — Other Ambulatory Visit (HOSPITAL_COMMUNITY): Payer: Self-pay

## 2020-05-19 ENCOUNTER — Other Ambulatory Visit (HOSPITAL_COMMUNITY): Payer: Self-pay

## 2020-05-26 ENCOUNTER — Other Ambulatory Visit (HOSPITAL_COMMUNITY): Payer: Self-pay

## 2020-05-27 ENCOUNTER — Other Ambulatory Visit (HOSPITAL_COMMUNITY): Payer: Self-pay

## 2020-05-27 MED ORDER — ALPRAZOLAM 0.5 MG PO TABS
ORAL_TABLET | ORAL | 5 refills | Status: DC
  Filled 2020-05-27: qty 90, 30d supply, fill #0
  Filled 2020-07-04: qty 90, 30d supply, fill #1
  Filled 2020-08-19: qty 90, 30d supply, fill #2
  Filled 2020-09-22: qty 90, 30d supply, fill #3
  Filled 2020-10-21: qty 90, 30d supply, fill #4

## 2020-06-15 ENCOUNTER — Other Ambulatory Visit (HOSPITAL_COMMUNITY): Payer: Self-pay

## 2020-06-15 MED FILL — Omeprazole Cap Delayed Release 20 MG: ORAL | 90 days supply | Qty: 180 | Fill #0 | Status: AC

## 2020-06-15 MED FILL — Diclofenac Sodium Gel 1% (1.16% Diethylamine Equiv): CUTANEOUS | 30 days supply | Qty: 200 | Fill #0 | Status: AC

## 2020-06-15 MED FILL — Tadalafil Tab 5 MG: ORAL | 30 days supply | Qty: 30 | Fill #1 | Status: AC

## 2020-06-15 MED FILL — Methotrexate Sodium Inj 50 MG/2ML (25 MG/ML): INTRAMUSCULAR | 46 days supply | Qty: 4 | Fill #0 | Status: AC

## 2020-07-04 ENCOUNTER — Other Ambulatory Visit (HOSPITAL_COMMUNITY): Payer: Self-pay

## 2020-07-18 ENCOUNTER — Other Ambulatory Visit (HOSPITAL_COMMUNITY): Payer: Self-pay

## 2020-07-18 MED FILL — Sertraline HCl Tab 100 MG: ORAL | 90 days supply | Qty: 90 | Fill #0 | Status: AC

## 2020-07-26 ENCOUNTER — Other Ambulatory Visit (HOSPITAL_COMMUNITY): Payer: Self-pay

## 2020-08-19 ENCOUNTER — Other Ambulatory Visit: Payer: Self-pay

## 2020-08-19 ENCOUNTER — Other Ambulatory Visit (HOSPITAL_COMMUNITY): Payer: Self-pay

## 2020-08-19 MED ORDER — ROSUVASTATIN CALCIUM 10 MG PO TABS
10.0000 mg | ORAL_TABLET | Freq: Every day | ORAL | 0 refills | Status: DC
  Filled 2020-08-19 – 2020-08-30 (×2): qty 90, 90d supply, fill #0

## 2020-08-19 MED FILL — Methotrexate Sodium Inj PF 50 MG/2ML (25 MG/ML): INTRAMUSCULAR | 42 days supply | Qty: 4 | Fill #0 | Status: CN

## 2020-08-19 MED FILL — Tadalafil Tab 5 MG: ORAL | 30 days supply | Qty: 30 | Fill #2 | Status: AC

## 2020-08-20 ENCOUNTER — Other Ambulatory Visit (HOSPITAL_COMMUNITY): Payer: Self-pay

## 2020-08-22 ENCOUNTER — Other Ambulatory Visit (HOSPITAL_COMMUNITY): Payer: Self-pay

## 2020-08-22 MED ORDER — DICLOFENAC SODIUM 1 % EX GEL
CUTANEOUS | 5 refills | Status: AC
  Filled 2020-08-22: qty 200, 30d supply, fill #0
  Filled 2020-11-25: qty 200, 16d supply, fill #0
  Filled 2021-01-11: qty 200, 16d supply, fill #1
  Filled 2021-01-26: qty 200, 16d supply, fill #2
  Filled 2021-02-17: qty 200, 16d supply, fill #3
  Filled 2021-03-27: qty 200, 16d supply, fill #4

## 2020-08-27 ENCOUNTER — Other Ambulatory Visit (HOSPITAL_COMMUNITY): Payer: Self-pay

## 2020-08-30 ENCOUNTER — Other Ambulatory Visit (HOSPITAL_COMMUNITY): Payer: Self-pay

## 2020-09-22 ENCOUNTER — Other Ambulatory Visit (HOSPITAL_COMMUNITY): Payer: Self-pay

## 2020-09-22 MED FILL — Methotrexate Sodium Inj PF 50 MG/2ML (25 MG/ML): INTRAMUSCULAR | 28 days supply | Qty: 4 | Fill #0 | Status: CN

## 2020-09-22 MED FILL — Omeprazole Cap Delayed Release 20 MG: ORAL | 90 days supply | Qty: 180 | Fill #1 | Status: AC

## 2020-09-22 MED FILL — Methotrexate Sodium Inj 50 MG/2ML (25 MG/ML): INTRAMUSCULAR | 30 days supply | Qty: 4 | Fill #0 | Status: AC

## 2020-10-17 ENCOUNTER — Other Ambulatory Visit (HOSPITAL_COMMUNITY): Payer: Self-pay

## 2020-10-17 ENCOUNTER — Other Ambulatory Visit: Payer: Self-pay

## 2020-10-17 MED ORDER — SERTRALINE HCL 100 MG PO TABS
ORAL_TABLET | ORAL | 1 refills | Status: AC
  Filled 2020-10-17: qty 90, 90d supply, fill #0
  Filled 2021-01-11: qty 90, 90d supply, fill #1

## 2020-10-18 ENCOUNTER — Other Ambulatory Visit (HOSPITAL_COMMUNITY): Payer: Self-pay

## 2020-10-21 ENCOUNTER — Other Ambulatory Visit (HOSPITAL_COMMUNITY): Payer: Self-pay

## 2020-10-28 ENCOUNTER — Other Ambulatory Visit (HOSPITAL_COMMUNITY): Payer: Self-pay

## 2020-11-24 ENCOUNTER — Other Ambulatory Visit: Payer: Self-pay

## 2020-11-24 ENCOUNTER — Other Ambulatory Visit (HOSPITAL_COMMUNITY): Payer: Self-pay

## 2020-11-24 MED ORDER — METHOTREXATE SODIUM CHEMO INJECTION (PF) 50 MG/2ML
INTRAMUSCULAR | 1 refills | Status: DC
  Filled 2020-11-24: qty 12, 42d supply, fill #0
  Filled 2021-01-11: qty 12, 42d supply, fill #1

## 2020-11-25 ENCOUNTER — Other Ambulatory Visit (HOSPITAL_COMMUNITY): Payer: Self-pay

## 2020-11-28 ENCOUNTER — Other Ambulatory Visit (HOSPITAL_COMMUNITY): Payer: Self-pay

## 2020-11-28 MED ORDER — OMEPRAZOLE 20 MG PO CPDR
DELAYED_RELEASE_CAPSULE | ORAL | 3 refills | Status: DC
  Filled 2020-11-28: qty 180, 90d supply, fill #0
  Filled 2021-03-27: qty 180, 90d supply, fill #1
  Filled 2021-06-20: qty 180, 90d supply, fill #2
  Filled 2021-09-21: qty 180, 90d supply, fill #3

## 2020-11-28 MED ORDER — TADALAFIL 5 MG PO TABS
5.0000 mg | ORAL_TABLET | Freq: Every day | ORAL | 3 refills | Status: DC
  Filled 2020-11-28: qty 30, 30d supply, fill #0
  Filled 2020-12-27 – 2021-01-11 (×2): qty 30, 30d supply, fill #1
  Filled 2021-02-17: qty 30, 30d supply, fill #2
  Filled 2021-03-27: qty 30, 30d supply, fill #3

## 2020-11-28 MED ORDER — ALPRAZOLAM 0.5 MG PO TABS
ORAL_TABLET | ORAL | 2 refills | Status: DC
  Filled 2020-11-28: qty 90, 30d supply, fill #0
  Filled 2020-12-27 – 2021-01-11 (×2): qty 90, 30d supply, fill #1
  Filled 2021-02-17: qty 90, 30d supply, fill #2

## 2020-11-28 MED ORDER — SERTRALINE HCL 100 MG PO TABS
ORAL_TABLET | ORAL | 3 refills | Status: DC
  Filled 2020-11-28 – 2021-04-12 (×2): qty 90, 90d supply, fill #0
  Filled 2021-07-24: qty 90, 90d supply, fill #1
  Filled 2021-10-27: qty 90, 90d supply, fill #2

## 2020-11-28 MED ORDER — ROSUVASTATIN CALCIUM 10 MG PO TABS
10.0000 mg | ORAL_TABLET | Freq: Every day | ORAL | 3 refills | Status: DC
  Filled 2020-11-28: qty 90, 90d supply, fill #0
  Filled 2021-04-12: qty 90, 90d supply, fill #1
  Filled 2021-07-24: qty 90, 90d supply, fill #2

## 2020-11-29 ENCOUNTER — Other Ambulatory Visit (HOSPITAL_COMMUNITY): Payer: Self-pay

## 2020-12-22 ENCOUNTER — Other Ambulatory Visit (HOSPITAL_COMMUNITY): Payer: Self-pay

## 2020-12-22 MED ORDER — AMOXICILLIN 500 MG PO CAPS
ORAL_CAPSULE | ORAL | 0 refills | Status: DC
  Filled 2020-12-22: qty 30, 10d supply, fill #0

## 2020-12-22 MED ORDER — CLINDAMYCIN HCL 300 MG PO CAPS
300.0000 mg | ORAL_CAPSULE | Freq: Three times a day (TID) | ORAL | 0 refills | Status: AC
  Filled 2020-12-22: qty 30, 10d supply, fill #0

## 2020-12-22 MED FILL — Omeprazole Cap Delayed Release 20 MG: ORAL | 90 days supply | Qty: 180 | Fill #2 | Status: CN

## 2020-12-23 ENCOUNTER — Other Ambulatory Visit (HOSPITAL_COMMUNITY): Payer: Self-pay

## 2020-12-27 ENCOUNTER — Other Ambulatory Visit (HOSPITAL_COMMUNITY): Payer: Self-pay

## 2021-01-05 ENCOUNTER — Other Ambulatory Visit (HOSPITAL_COMMUNITY): Payer: Self-pay

## 2021-01-11 ENCOUNTER — Other Ambulatory Visit (HOSPITAL_COMMUNITY): Payer: Self-pay

## 2021-01-12 ENCOUNTER — Other Ambulatory Visit (HOSPITAL_COMMUNITY): Payer: Self-pay

## 2021-01-26 ENCOUNTER — Other Ambulatory Visit (HOSPITAL_COMMUNITY): Payer: Self-pay

## 2021-02-17 ENCOUNTER — Other Ambulatory Visit (HOSPITAL_COMMUNITY): Payer: Self-pay

## 2021-03-27 ENCOUNTER — Other Ambulatory Visit (HOSPITAL_COMMUNITY): Payer: Self-pay

## 2021-03-27 MED ORDER — ALPRAZOLAM 0.5 MG PO TABS
ORAL_TABLET | ORAL | 2 refills | Status: DC
  Filled 2021-03-27: qty 90, 30d supply, fill #0
  Filled 2021-05-01: qty 90, 30d supply, fill #1
  Filled 2021-05-29: qty 90, 30d supply, fill #2

## 2021-04-12 ENCOUNTER — Other Ambulatory Visit (HOSPITAL_COMMUNITY): Payer: Self-pay

## 2021-05-01 ENCOUNTER — Other Ambulatory Visit (HOSPITAL_COMMUNITY): Payer: Self-pay

## 2021-05-01 MED ORDER — TADALAFIL 5 MG PO TABS
5.0000 mg | ORAL_TABLET | Freq: Every day | ORAL | 3 refills | Status: DC
  Filled 2021-05-01: qty 30, 30d supply, fill #0
  Filled 2021-05-29: qty 30, 30d supply, fill #1
  Filled 2021-06-20: qty 30, 30d supply, fill #2
  Filled 2021-07-24: qty 30, 30d supply, fill #3

## 2021-05-01 MED ORDER — METHOTREXATE SODIUM CHEMO INJECTION (PF) 50 MG/2ML
INTRAMUSCULAR | 1 refills | Status: DC
  Filled 2021-05-01 – 2021-07-24 (×3): qty 12, 42d supply, fill #0
  Filled 2021-10-27: qty 12, 42d supply, fill #1

## 2021-05-02 ENCOUNTER — Other Ambulatory Visit (HOSPITAL_COMMUNITY): Payer: Self-pay

## 2021-05-11 ENCOUNTER — Other Ambulatory Visit (HOSPITAL_COMMUNITY): Payer: Self-pay

## 2021-05-12 ENCOUNTER — Other Ambulatory Visit (HOSPITAL_COMMUNITY): Payer: Self-pay

## 2021-05-16 ENCOUNTER — Other Ambulatory Visit (HOSPITAL_COMMUNITY): Payer: Self-pay

## 2021-05-29 ENCOUNTER — Other Ambulatory Visit (HOSPITAL_COMMUNITY): Payer: Self-pay

## 2021-06-20 ENCOUNTER — Other Ambulatory Visit (HOSPITAL_COMMUNITY): Payer: Self-pay

## 2021-06-21 ENCOUNTER — Other Ambulatory Visit (HOSPITAL_COMMUNITY): Payer: Self-pay

## 2021-07-07 ENCOUNTER — Other Ambulatory Visit (HOSPITAL_COMMUNITY): Payer: Self-pay

## 2021-07-10 ENCOUNTER — Other Ambulatory Visit (HOSPITAL_COMMUNITY): Payer: Self-pay

## 2021-07-10 ENCOUNTER — Other Ambulatory Visit: Payer: Self-pay

## 2021-07-18 ENCOUNTER — Other Ambulatory Visit (HOSPITAL_COMMUNITY): Payer: Self-pay

## 2021-07-18 MED ORDER — ALPRAZOLAM 0.5 MG PO TABS
ORAL_TABLET | ORAL | 5 refills | Status: AC
  Filled 2021-07-18: qty 60, 30d supply, fill #0
  Filled 2021-09-21: qty 60, 30d supply, fill #1
  Filled 2021-10-27: qty 60, 30d supply, fill #2
  Filled 2021-11-27: qty 60, 30d supply, fill #3
  Filled 2022-01-04: qty 60, 30d supply, fill #4

## 2021-07-21 ENCOUNTER — Other Ambulatory Visit: Payer: Self-pay

## 2021-07-22 ENCOUNTER — Other Ambulatory Visit (HOSPITAL_COMMUNITY): Payer: Self-pay

## 2021-07-24 ENCOUNTER — Other Ambulatory Visit (HOSPITAL_COMMUNITY): Payer: Self-pay

## 2021-08-23 ENCOUNTER — Other Ambulatory Visit (HOSPITAL_COMMUNITY): Payer: Self-pay

## 2021-08-23 MED ORDER — TADALAFIL 5 MG PO TABS
5.0000 mg | ORAL_TABLET | Freq: Every day | ORAL | 3 refills | Status: DC
  Filled 2021-08-23: qty 30, 30d supply, fill #0
  Filled 2021-09-21: qty 30, 30d supply, fill #1
  Filled 2021-10-27: qty 30, 30d supply, fill #2
  Filled 2021-12-05: qty 30, 30d supply, fill #3

## 2021-09-21 ENCOUNTER — Other Ambulatory Visit (HOSPITAL_COMMUNITY): Payer: Self-pay

## 2021-10-27 ENCOUNTER — Other Ambulatory Visit (HOSPITAL_COMMUNITY): Payer: Self-pay

## 2021-10-27 ENCOUNTER — Other Ambulatory Visit: Payer: Self-pay

## 2021-10-27 MED ORDER — OMEPRAZOLE 20 MG PO CPDR
20.0000 mg | DELAYED_RELEASE_CAPSULE | Freq: Two times a day (BID) | ORAL | 3 refills | Status: DC
  Filled 2021-10-27 – 2022-07-12 (×2): qty 180, 90d supply, fill #0
  Filled 2022-10-08 – 2022-10-11 (×3): qty 180, 90d supply, fill #1

## 2021-10-30 ENCOUNTER — Other Ambulatory Visit (HOSPITAL_COMMUNITY): Payer: Self-pay

## 2021-11-03 ENCOUNTER — Other Ambulatory Visit (HOSPITAL_COMMUNITY): Payer: Self-pay

## 2021-11-07 ENCOUNTER — Other Ambulatory Visit (HOSPITAL_COMMUNITY): Payer: Self-pay

## 2021-11-17 ENCOUNTER — Other Ambulatory Visit (HOSPITAL_COMMUNITY): Payer: Self-pay

## 2021-11-18 ENCOUNTER — Other Ambulatory Visit (HOSPITAL_COMMUNITY): Payer: Self-pay

## 2021-11-22 ENCOUNTER — Other Ambulatory Visit (HOSPITAL_COMMUNITY): Payer: Self-pay

## 2021-11-27 ENCOUNTER — Other Ambulatory Visit (HOSPITAL_COMMUNITY): Payer: Self-pay

## 2021-11-27 MED ORDER — FOLIC ACID 1 MG PO TABS
ORAL_TABLET | ORAL | 3 refills | Status: DC
  Filled 2021-11-27: qty 90, 90d supply, fill #0
  Filled 2022-05-11: qty 90, 90d supply, fill #1
  Filled 2022-08-04 – 2022-08-08 (×2): qty 90, 90d supply, fill #2
  Filled 2022-10-31: qty 90, 90d supply, fill #3

## 2021-11-27 MED ORDER — METHOTREXATE SODIUM CHEMO INJECTION (PF) 50 MG/2ML
INTRAMUSCULAR | 1 refills | Status: DC
  Filled 2021-11-27 – 2021-11-30 (×2): qty 24, 84d supply, fill #0
  Filled 2022-05-11: qty 24, 84d supply, fill #1

## 2021-11-28 ENCOUNTER — Other Ambulatory Visit: Payer: Self-pay | Admitting: Internal Medicine

## 2021-11-28 ENCOUNTER — Other Ambulatory Visit (HOSPITAL_COMMUNITY): Payer: Self-pay

## 2021-11-28 DIAGNOSIS — M810 Age-related osteoporosis without current pathological fracture: Secondary | ICD-10-CM

## 2021-11-28 MED ORDER — SERTRALINE HCL 100 MG PO TABS
100.0000 mg | ORAL_TABLET | Freq: Every day | ORAL | 3 refills | Status: AC
  Filled 2021-11-28 – 2022-10-31 (×2): qty 90, 90d supply, fill #0

## 2021-11-28 MED ORDER — ALPRAZOLAM 0.5 MG PO TABS
0.5000 mg | ORAL_TABLET | Freq: Two times a day (BID) | ORAL | 5 refills | Status: DC | PRN
  Filled 2021-11-28 – 2022-02-20 (×2): qty 60, 30d supply, fill #0
  Filled 2022-03-18 – 2022-04-11 (×4): qty 60, 30d supply, fill #1
  Filled 2022-05-11: qty 60, 30d supply, fill #2

## 2021-11-29 ENCOUNTER — Other Ambulatory Visit (HOSPITAL_COMMUNITY): Payer: Self-pay

## 2021-11-30 ENCOUNTER — Other Ambulatory Visit (HOSPITAL_COMMUNITY): Payer: Self-pay

## 2021-12-01 ENCOUNTER — Other Ambulatory Visit (HOSPITAL_COMMUNITY): Payer: Self-pay

## 2021-12-05 ENCOUNTER — Other Ambulatory Visit (HOSPITAL_COMMUNITY): Payer: Self-pay

## 2021-12-06 ENCOUNTER — Other Ambulatory Visit (HOSPITAL_COMMUNITY): Payer: Self-pay

## 2021-12-07 ENCOUNTER — Other Ambulatory Visit (HOSPITAL_COMMUNITY): Payer: Self-pay

## 2022-01-04 ENCOUNTER — Other Ambulatory Visit (HOSPITAL_COMMUNITY): Payer: Self-pay

## 2022-01-05 ENCOUNTER — Other Ambulatory Visit: Payer: Self-pay

## 2022-01-05 ENCOUNTER — Other Ambulatory Visit (HOSPITAL_COMMUNITY): Payer: Self-pay

## 2022-01-05 MED ORDER — TADALAFIL 5 MG PO TABS
5.0000 mg | ORAL_TABLET | Freq: Every day | ORAL | 3 refills | Status: DC
  Filled 2022-01-05: qty 30, 30d supply, fill #0
  Filled 2022-02-05: qty 30, 30d supply, fill #1
  Filled 2022-03-18 – 2022-03-19 (×2): qty 30, 30d supply, fill #2
  Filled 2022-04-15 – 2022-04-16 (×2): qty 30, 30d supply, fill #3

## 2022-01-23 ENCOUNTER — Other Ambulatory Visit (HOSPITAL_COMMUNITY): Payer: Self-pay

## 2022-01-31 ENCOUNTER — Other Ambulatory Visit (HOSPITAL_COMMUNITY): Payer: Self-pay

## 2022-01-31 MED ORDER — SERTRALINE HCL 100 MG PO TABS
100.0000 mg | ORAL_TABLET | Freq: Every day | ORAL | 3 refills | Status: DC
  Filled 2022-01-31: qty 90, 90d supply, fill #0
  Filled 2022-04-15 – 2022-04-16 (×2): qty 90, 90d supply, fill #1
  Filled 2022-08-04 – 2022-08-08 (×2): qty 90, 90d supply, fill #2
  Filled 2023-01-29: qty 90, 90d supply, fill #3

## 2022-02-05 ENCOUNTER — Other Ambulatory Visit (HOSPITAL_COMMUNITY): Payer: Self-pay

## 2022-02-09 ENCOUNTER — Other Ambulatory Visit (HOSPITAL_COMMUNITY): Payer: Self-pay

## 2022-02-20 ENCOUNTER — Other Ambulatory Visit (HOSPITAL_COMMUNITY): Payer: Self-pay

## 2022-02-27 ENCOUNTER — Ambulatory Visit: Payer: Self-pay

## 2022-03-19 ENCOUNTER — Other Ambulatory Visit (HOSPITAL_COMMUNITY): Payer: Self-pay

## 2022-03-20 ENCOUNTER — Other Ambulatory Visit (HOSPITAL_COMMUNITY): Payer: Self-pay

## 2022-03-20 ENCOUNTER — Other Ambulatory Visit: Payer: Self-pay

## 2022-03-22 ENCOUNTER — Other Ambulatory Visit: Payer: Self-pay

## 2022-03-22 ENCOUNTER — Other Ambulatory Visit (HOSPITAL_COMMUNITY): Payer: Self-pay

## 2022-03-30 ENCOUNTER — Other Ambulatory Visit (HOSPITAL_COMMUNITY): Payer: Self-pay

## 2022-04-12 ENCOUNTER — Other Ambulatory Visit: Payer: Self-pay

## 2022-04-16 ENCOUNTER — Other Ambulatory Visit (HOSPITAL_COMMUNITY): Payer: Self-pay

## 2022-05-11 ENCOUNTER — Other Ambulatory Visit (HOSPITAL_COMMUNITY): Payer: Self-pay

## 2022-05-11 MED ORDER — TADALAFIL 5 MG PO TABS
5.0000 mg | ORAL_TABLET | Freq: Every day | ORAL | 3 refills | Status: DC
  Filled 2022-05-11: qty 30, 30d supply, fill #0
  Filled 2022-06-12: qty 30, 30d supply, fill #1
  Filled 2022-07-12: qty 30, 30d supply, fill #2
  Filled 2022-08-04 – 2022-08-17 (×4): qty 30, 30d supply, fill #3

## 2022-05-14 ENCOUNTER — Other Ambulatory Visit (HOSPITAL_COMMUNITY): Payer: Self-pay

## 2022-05-24 ENCOUNTER — Inpatient Hospital Stay: Admission: RE | Admit: 2022-05-24 | Payer: Medicare Other | Source: Ambulatory Visit

## 2022-06-13 ENCOUNTER — Other Ambulatory Visit (HOSPITAL_COMMUNITY): Payer: Self-pay

## 2022-06-13 ENCOUNTER — Other Ambulatory Visit: Payer: Self-pay

## 2022-06-25 ENCOUNTER — Other Ambulatory Visit (HOSPITAL_COMMUNITY): Payer: Self-pay

## 2022-07-12 ENCOUNTER — Other Ambulatory Visit (HOSPITAL_COMMUNITY): Payer: Self-pay

## 2022-07-13 ENCOUNTER — Other Ambulatory Visit: Payer: Self-pay

## 2022-07-13 ENCOUNTER — Other Ambulatory Visit: Payer: Self-pay | Admitting: Family Medicine

## 2022-07-13 ENCOUNTER — Other Ambulatory Visit (HOSPITAL_COMMUNITY): Payer: Self-pay

## 2022-07-14 ENCOUNTER — Other Ambulatory Visit (HOSPITAL_COMMUNITY): Payer: Self-pay

## 2022-07-16 ENCOUNTER — Other Ambulatory Visit (HOSPITAL_COMMUNITY): Payer: Self-pay

## 2022-07-24 ENCOUNTER — Other Ambulatory Visit (HOSPITAL_COMMUNITY): Payer: Self-pay

## 2022-07-25 ENCOUNTER — Other Ambulatory Visit (HOSPITAL_COMMUNITY): Payer: Self-pay

## 2022-08-01 ENCOUNTER — Other Ambulatory Visit (HOSPITAL_COMMUNITY): Payer: Self-pay

## 2022-08-02 ENCOUNTER — Other Ambulatory Visit (HOSPITAL_COMMUNITY): Payer: Self-pay

## 2022-08-02 MED ORDER — METHOTREXATE SODIUM CHEMO INJECTION (PF) 50 MG/2ML
INTRAMUSCULAR | 0 refills | Status: DC
  Filled 2022-08-02: qty 8, 28d supply, fill #0

## 2022-08-03 ENCOUNTER — Other Ambulatory Visit (HOSPITAL_COMMUNITY): Payer: Self-pay

## 2022-08-03 MED ORDER — ALPRAZOLAM 0.5 MG PO TABS
0.5000 mg | ORAL_TABLET | Freq: Two times a day (BID) | ORAL | 0 refills | Status: AC | PRN
  Filled 2022-08-03: qty 60, 30d supply, fill #0

## 2022-08-04 ENCOUNTER — Other Ambulatory Visit (HOSPITAL_COMMUNITY): Payer: Self-pay

## 2022-08-08 ENCOUNTER — Other Ambulatory Visit (HOSPITAL_COMMUNITY): Payer: Self-pay

## 2022-08-08 ENCOUNTER — Other Ambulatory Visit: Payer: Self-pay

## 2022-08-09 ENCOUNTER — Other Ambulatory Visit (HOSPITAL_COMMUNITY): Payer: Self-pay

## 2022-08-17 ENCOUNTER — Other Ambulatory Visit (HOSPITAL_COMMUNITY): Payer: Self-pay

## 2022-08-27 ENCOUNTER — Other Ambulatory Visit (HOSPITAL_COMMUNITY): Payer: Self-pay

## 2022-08-29 ENCOUNTER — Other Ambulatory Visit (HOSPITAL_COMMUNITY): Payer: Self-pay

## 2022-08-30 ENCOUNTER — Other Ambulatory Visit (HOSPITAL_COMMUNITY): Payer: Self-pay

## 2022-08-30 MED ORDER — METHOTREXATE SODIUM CHEMO INJECTION (PF) 50 MG/2ML
12.5000 mg | INTRAMUSCULAR | 3 refills | Status: AC
  Filled 2022-08-30: qty 6, 84d supply, fill #0
  Filled 2022-09-03: qty 8, 28d supply, fill #0
  Filled 2022-10-11: qty 8, 28d supply, fill #1
  Filled 2022-12-21: qty 8, 28d supply, fill #2

## 2022-08-31 ENCOUNTER — Other Ambulatory Visit: Payer: Self-pay

## 2022-09-03 ENCOUNTER — Other Ambulatory Visit: Payer: Self-pay

## 2022-09-05 ENCOUNTER — Other Ambulatory Visit (HOSPITAL_BASED_OUTPATIENT_CLINIC_OR_DEPARTMENT_OTHER): Payer: Self-pay

## 2022-09-10 ENCOUNTER — Other Ambulatory Visit (HOSPITAL_COMMUNITY): Payer: Self-pay

## 2022-09-10 MED ORDER — TADALAFIL 5 MG PO TABS
5.0000 mg | ORAL_TABLET | ORAL | 3 refills | Status: DC | PRN
  Filled 2022-09-10: qty 30, 30d supply, fill #0
  Filled 2022-10-11: qty 30, 30d supply, fill #1
  Filled 2022-11-19: qty 30, 30d supply, fill #2
  Filled 2022-12-21: qty 30, 30d supply, fill #3

## 2022-10-11 ENCOUNTER — Other Ambulatory Visit (HOSPITAL_COMMUNITY): Payer: Self-pay

## 2022-10-12 ENCOUNTER — Other Ambulatory Visit (HOSPITAL_COMMUNITY): Payer: Self-pay

## 2022-10-22 ENCOUNTER — Other Ambulatory Visit (HOSPITAL_COMMUNITY): Payer: Self-pay

## 2022-10-22 MED ORDER — TAMSULOSIN HCL 0.4 MG PO CAPS
0.4000 mg | ORAL_CAPSULE | Freq: Every day | ORAL | 1 refills | Status: DC
  Filled 2022-10-22: qty 90, 90d supply, fill #0
  Filled 2023-01-14: qty 90, 90d supply, fill #1

## 2022-10-31 ENCOUNTER — Other Ambulatory Visit (HOSPITAL_COMMUNITY): Payer: Self-pay

## 2022-10-31 ENCOUNTER — Other Ambulatory Visit: Payer: Self-pay

## 2022-11-19 ENCOUNTER — Other Ambulatory Visit (HOSPITAL_COMMUNITY): Payer: Self-pay

## 2022-11-27 ENCOUNTER — Other Ambulatory Visit (HOSPITAL_COMMUNITY): Payer: Self-pay

## 2022-12-05 ENCOUNTER — Other Ambulatory Visit (HOSPITAL_COMMUNITY): Payer: Self-pay

## 2022-12-05 ENCOUNTER — Other Ambulatory Visit: Payer: Self-pay

## 2022-12-05 MED ORDER — CIPROFLOXACIN HCL 0.3 % OP SOLN
OPHTHALMIC | 0 refills | Status: AC
  Filled 2022-12-05: qty 5, 25d supply, fill #0

## 2022-12-05 MED ORDER — KETOROLAC TROMETHAMINE 0.5 % OP SOLN
OPHTHALMIC | 0 refills | Status: AC
  Filled 2022-12-05: qty 5, 25d supply, fill #0
  Filled 2022-12-08: qty 5, 3d supply, fill #0

## 2022-12-05 MED ORDER — PREDNISOLONE ACETATE 1 % OP SUSP
OPHTHALMIC | 1 refills | Status: AC
  Filled 2022-12-05: qty 5, 25d supply, fill #0
  Filled 2022-12-08: qty 5, 3d supply, fill #0
  Filled 2022-12-12: qty 5, 3d supply, fill #1

## 2022-12-06 ENCOUNTER — Encounter: Payer: Self-pay | Admitting: Pharmacist

## 2022-12-06 ENCOUNTER — Other Ambulatory Visit: Payer: Self-pay

## 2022-12-07 ENCOUNTER — Other Ambulatory Visit: Payer: Self-pay

## 2022-12-08 ENCOUNTER — Other Ambulatory Visit (HOSPITAL_COMMUNITY): Payer: Self-pay

## 2022-12-10 ENCOUNTER — Other Ambulatory Visit: Payer: Self-pay

## 2022-12-11 ENCOUNTER — Other Ambulatory Visit (HOSPITAL_COMMUNITY): Payer: Self-pay

## 2022-12-21 ENCOUNTER — Other Ambulatory Visit (HOSPITAL_COMMUNITY): Payer: Self-pay

## 2022-12-22 ENCOUNTER — Other Ambulatory Visit (HOSPITAL_COMMUNITY): Payer: Self-pay

## 2022-12-24 ENCOUNTER — Other Ambulatory Visit (HOSPITAL_COMMUNITY): Payer: Self-pay

## 2022-12-24 MED ORDER — ALPRAZOLAM 0.5 MG PO TABS
0.5000 mg | ORAL_TABLET | Freq: Two times a day (BID) | ORAL | 0 refills | Status: DC | PRN
  Filled 2022-12-24: qty 60, 30d supply, fill #0

## 2022-12-26 ENCOUNTER — Other Ambulatory Visit (HOSPITAL_COMMUNITY): Payer: Self-pay

## 2022-12-27 ENCOUNTER — Other Ambulatory Visit (HOSPITAL_COMMUNITY): Payer: Self-pay

## 2023-01-03 ENCOUNTER — Other Ambulatory Visit (HOSPITAL_COMMUNITY): Payer: Self-pay

## 2023-01-03 ENCOUNTER — Other Ambulatory Visit: Payer: Self-pay

## 2023-01-03 MED ORDER — BRIMONIDINE TARTRATE 0.2 % OP SOLN
1.0000 [drp] | Freq: Two times a day (BID) | OPHTHALMIC | 1 refills | Status: AC
  Filled 2023-01-03 (×2): qty 5, 50d supply, fill #0
  Filled 2023-02-15: qty 5, 50d supply, fill #1

## 2023-01-05 ENCOUNTER — Other Ambulatory Visit (HOSPITAL_COMMUNITY): Payer: Self-pay

## 2023-01-07 ENCOUNTER — Other Ambulatory Visit (HOSPITAL_COMMUNITY): Payer: Self-pay

## 2023-01-07 ENCOUNTER — Other Ambulatory Visit: Payer: Self-pay

## 2023-01-07 MED ORDER — OMEPRAZOLE 20 MG PO CPDR
20.0000 mg | DELAYED_RELEASE_CAPSULE | Freq: Two times a day (BID) | ORAL | 3 refills | Status: DC
  Filled 2023-01-07: qty 180, 90d supply, fill #0
  Filled 2023-02-28 – 2023-04-01 (×2): qty 180, 90d supply, fill #1

## 2023-01-17 ENCOUNTER — Other Ambulatory Visit (HOSPITAL_COMMUNITY): Payer: Self-pay

## 2023-01-17 MED ORDER — ALPRAZOLAM 0.5 MG PO TABS
0.5000 mg | ORAL_TABLET | Freq: Two times a day (BID) | ORAL | 0 refills | Status: DC | PRN
Start: 2023-01-17 — End: 2023-03-27
  Filled 2023-02-28: qty 60, 30d supply, fill #0

## 2023-01-19 ENCOUNTER — Other Ambulatory Visit (HOSPITAL_COMMUNITY): Payer: Self-pay

## 2023-01-21 ENCOUNTER — Other Ambulatory Visit (HOSPITAL_BASED_OUTPATIENT_CLINIC_OR_DEPARTMENT_OTHER): Payer: Self-pay

## 2023-01-21 ENCOUNTER — Other Ambulatory Visit (HOSPITAL_COMMUNITY): Payer: Self-pay

## 2023-01-21 MED ORDER — TADALAFIL 5 MG PO TABS
5.0000 mg | ORAL_TABLET | Freq: Every day | ORAL | 3 refills | Status: DC | PRN
  Filled 2023-01-21: qty 30, 30d supply, fill #0
  Filled 2023-02-28: qty 30, 30d supply, fill #1
  Filled 2023-03-28: qty 30, 30d supply, fill #2
  Filled 2023-04-29: qty 30, 30d supply, fill #3

## 2023-01-29 ENCOUNTER — Other Ambulatory Visit: Payer: Self-pay

## 2023-01-29 ENCOUNTER — Other Ambulatory Visit (HOSPITAL_COMMUNITY): Payer: Self-pay

## 2023-01-29 MED ORDER — FOLIC ACID 1 MG PO TABS
1.0000 mg | ORAL_TABLET | Freq: Every day | ORAL | 3 refills | Status: DC
  Filled 2023-01-29: qty 90, 90d supply, fill #0
  Filled 2023-02-28 – 2023-04-23 (×2): qty 90, 90d supply, fill #1
  Filled 2023-07-22: qty 90, 90d supply, fill #2
  Filled 2023-10-20: qty 90, 90d supply, fill #3

## 2023-02-28 ENCOUNTER — Other Ambulatory Visit: Payer: Self-pay

## 2023-02-28 ENCOUNTER — Other Ambulatory Visit (HOSPITAL_COMMUNITY): Payer: Self-pay

## 2023-02-28 MED ORDER — SERTRALINE HCL 100 MG PO TABS
100.0000 mg | ORAL_TABLET | Freq: Every day | ORAL | 3 refills | Status: AC
  Filled 2023-04-29: qty 90, 90d supply, fill #0
  Filled 2023-07-22: qty 90, 90d supply, fill #1
  Filled 2023-10-19: qty 90, 90d supply, fill #2
  Filled 2024-01-17: qty 90, 90d supply, fill #3

## 2023-02-28 MED ORDER — TAMSULOSIN HCL 0.4 MG PO CAPS
0.4000 mg | ORAL_CAPSULE | Freq: Every day | ORAL | 1 refills | Status: AC
  Filled 2023-04-13: qty 90, 90d supply, fill #0
  Filled 2023-07-12: qty 90, 90d supply, fill #1

## 2023-03-01 ENCOUNTER — Other Ambulatory Visit: Payer: Self-pay

## 2023-03-16 ENCOUNTER — Other Ambulatory Visit (HOSPITAL_COMMUNITY): Payer: Self-pay

## 2023-03-27 ENCOUNTER — Other Ambulatory Visit (HOSPITAL_COMMUNITY): Payer: Self-pay

## 2023-03-27 MED ORDER — ALPRAZOLAM 0.5 MG PO TABS: 0.5000 mg | ORAL_TABLET | Freq: Two times a day (BID) | ORAL | 2 refills | Status: AC | PRN

## 2023-03-28 ENCOUNTER — Other Ambulatory Visit: Payer: Self-pay

## 2023-03-28 ENCOUNTER — Other Ambulatory Visit (HOSPITAL_COMMUNITY): Payer: Self-pay

## 2023-04-13 ENCOUNTER — Other Ambulatory Visit (HOSPITAL_COMMUNITY): Payer: Self-pay

## 2023-04-29 ENCOUNTER — Other Ambulatory Visit (HOSPITAL_COMMUNITY): Payer: Self-pay

## 2023-05-27 ENCOUNTER — Other Ambulatory Visit: Payer: Self-pay

## 2023-05-27 ENCOUNTER — Other Ambulatory Visit (HOSPITAL_COMMUNITY): Payer: Self-pay

## 2023-05-27 MED ORDER — TADALAFIL 5 MG PO TABS
5.0000 mg | ORAL_TABLET | Freq: Every day | ORAL | 3 refills | Status: AC | PRN
  Filled 2023-05-27: qty 30, 30d supply, fill #0
  Filled 2023-05-29: qty 30, 30d supply, fill #1

## 2023-05-29 ENCOUNTER — Other Ambulatory Visit (HOSPITAL_COMMUNITY): Payer: Self-pay

## 2023-06-11 ENCOUNTER — Other Ambulatory Visit (HOSPITAL_COMMUNITY): Payer: Self-pay

## 2023-06-11 ENCOUNTER — Other Ambulatory Visit: Payer: Self-pay

## 2023-06-11 MED ORDER — SERTRALINE HCL 100 MG PO TABS
100.0000 mg | ORAL_TABLET | Freq: Every day | ORAL | 3 refills | Status: AC
  Filled 2023-06-11: qty 90, 90d supply, fill #0

## 2023-06-11 MED ORDER — TADALAFIL 5 MG PO TABS
5.0000 mg | ORAL_TABLET | ORAL | 3 refills | Status: DC
  Filled 2023-06-12: qty 30, fill #0
  Filled 2023-06-13 – 2023-06-14 (×2): qty 30, 30d supply, fill #0
  Filled 2023-06-15 – 2023-06-18 (×3): qty 30, fill #0
  Filled 2023-06-19: qty 30, 30d supply, fill #0
  Filled 2023-07-12: qty 30, 30d supply, fill #1
  Filled 2023-08-12: qty 30, 30d supply, fill #2
  Filled 2023-09-10: qty 30, 30d supply, fill #3

## 2023-06-11 MED ORDER — TAMSULOSIN HCL 0.4 MG PO CAPS
0.4000 mg | ORAL_CAPSULE | Freq: Every day | ORAL | 1 refills | Status: DC
Start: 2023-06-11 — End: 2023-07-04
  Filled 2023-06-11: qty 90, 90d supply, fill #0

## 2023-06-11 MED ORDER — ALPRAZOLAM 0.5 MG PO TABS
0.5000 mg | ORAL_TABLET | Freq: Two times a day (BID) | ORAL | 2 refills | Status: DC | PRN
  Filled 2023-06-11 – 2023-06-12 (×2): qty 60, 30d supply, fill #0
  Filled 2023-10-31: qty 60, 30d supply, fill #1

## 2023-06-11 MED ORDER — OMEPRAZOLE 40 MG PO CPDR
40.0000 mg | DELAYED_RELEASE_CAPSULE | Freq: Every morning | ORAL | 3 refills | Status: AC
  Filled 2023-06-11 – 2023-06-12 (×2): qty 90, 90d supply, fill #0
  Filled 2023-09-04 (×2): qty 90, 90d supply, fill #1
  Filled 2023-12-03: qty 90, 90d supply, fill #2

## 2023-06-12 ENCOUNTER — Other Ambulatory Visit (HOSPITAL_COMMUNITY): Payer: Self-pay

## 2023-06-13 ENCOUNTER — Other Ambulatory Visit (HOSPITAL_COMMUNITY): Payer: Self-pay

## 2023-06-13 ENCOUNTER — Other Ambulatory Visit: Payer: Self-pay

## 2023-06-14 ENCOUNTER — Other Ambulatory Visit: Payer: Self-pay

## 2023-06-14 ENCOUNTER — Other Ambulatory Visit (HOSPITAL_COMMUNITY): Payer: Self-pay

## 2023-06-15 ENCOUNTER — Other Ambulatory Visit (HOSPITAL_COMMUNITY): Payer: Self-pay

## 2023-06-17 ENCOUNTER — Other Ambulatory Visit (HOSPITAL_COMMUNITY): Payer: Self-pay

## 2023-06-18 ENCOUNTER — Other Ambulatory Visit (HOSPITAL_COMMUNITY): Payer: Self-pay

## 2023-06-19 ENCOUNTER — Other Ambulatory Visit: Payer: Self-pay

## 2023-06-19 ENCOUNTER — Other Ambulatory Visit (HOSPITAL_COMMUNITY): Payer: Self-pay

## 2023-10-04 ENCOUNTER — Other Ambulatory Visit (HOSPITAL_COMMUNITY): Payer: Self-pay

## 2023-10-04 MED ORDER — TADALAFIL 5 MG PO TABS
5.0000 mg | ORAL_TABLET | Freq: Every day | ORAL | 3 refills | Status: AC | PRN
  Filled 2023-10-04: qty 30, 30d supply, fill #0
  Filled 2024-01-03: qty 30, 30d supply, fill #1
  Filled 2024-02-06: qty 30, 30d supply, fill #2

## 2023-10-27 ENCOUNTER — Other Ambulatory Visit (HOSPITAL_COMMUNITY): Payer: Self-pay

## 2023-10-29 ENCOUNTER — Encounter (HOSPITAL_COMMUNITY): Payer: Self-pay

## 2023-10-29 ENCOUNTER — Other Ambulatory Visit (HOSPITAL_COMMUNITY): Payer: Self-pay

## 2023-10-31 ENCOUNTER — Other Ambulatory Visit (HOSPITAL_COMMUNITY): Payer: Self-pay

## 2023-11-01 ENCOUNTER — Other Ambulatory Visit: Payer: Self-pay

## 2023-12-09 ENCOUNTER — Other Ambulatory Visit (HOSPITAL_COMMUNITY): Payer: Self-pay

## 2023-12-10 ENCOUNTER — Other Ambulatory Visit: Payer: Self-pay

## 2023-12-10 ENCOUNTER — Other Ambulatory Visit (HOSPITAL_COMMUNITY): Payer: Self-pay

## 2023-12-10 MED ORDER — ALPRAZOLAM 0.5 MG PO TABS
0.5000 mg | ORAL_TABLET | Freq: Two times a day (BID) | ORAL | 2 refills | Status: AC | PRN
  Filled 2023-12-10: qty 60, 30d supply, fill #0

## 2024-01-03 ENCOUNTER — Other Ambulatory Visit (HOSPITAL_COMMUNITY): Payer: Self-pay

## 2024-01-06 ENCOUNTER — Other Ambulatory Visit (HOSPITAL_COMMUNITY): Payer: Self-pay

## 2024-01-06 MED ORDER — FOLIC ACID 1 MG PO TABS
1.0000 mg | ORAL_TABLET | Freq: Every day | ORAL | 1 refills | Status: AC
  Filled 2024-01-06: qty 90, 90d supply, fill #0

## 2024-01-07 ENCOUNTER — Other Ambulatory Visit (HOSPITAL_COMMUNITY): Payer: Self-pay

## 2024-02-06 ENCOUNTER — Other Ambulatory Visit (HOSPITAL_COMMUNITY): Payer: Self-pay

## 2024-02-08 ENCOUNTER — Other Ambulatory Visit (HOSPITAL_COMMUNITY): Payer: Self-pay
